# Patient Record
Sex: Female | Born: 1985 | Race: White | Hispanic: No | Marital: Married | Smoking: Never smoker
Health system: Southern US, Community
[De-identification: ages and names within clinical notes are randomized; demographics above are authoritative.]

## PROBLEM LIST (undated history)

## (undated) ENCOUNTER — Inpatient Hospital Stay (HOSPITAL_COMMUNITY): Payer: Self-pay

## (undated) DIAGNOSIS — H6690 Otitis media, unspecified, unspecified ear: Secondary | ICD-10-CM

## (undated) DIAGNOSIS — F419 Anxiety disorder, unspecified: Secondary | ICD-10-CM

## (undated) DIAGNOSIS — J029 Acute pharyngitis, unspecified: Secondary | ICD-10-CM

## (undated) DIAGNOSIS — Z8619 Personal history of other infectious and parasitic diseases: Secondary | ICD-10-CM

## (undated) HISTORY — DX: Acute pharyngitis, unspecified: J02.9

## (undated) HISTORY — DX: Personal history of other infectious and parasitic diseases: Z86.19

## (undated) HISTORY — DX: Otitis media, unspecified, unspecified ear: H66.90

---

## 2008-08-29 ENCOUNTER — Emergency Department (HOSPITAL_COMMUNITY): Admission: EM | Admit: 2008-08-29 | Discharge: 2008-08-29 | Payer: Self-pay | Admitting: Emergency Medicine

## 2009-02-22 ENCOUNTER — Inpatient Hospital Stay (HOSPITAL_COMMUNITY): Admission: AD | Admit: 2009-02-22 | Discharge: 2009-02-23 | Payer: Self-pay | Admitting: Obstetrics and Gynecology

## 2009-04-09 ENCOUNTER — Inpatient Hospital Stay (HOSPITAL_COMMUNITY): Admission: AD | Admit: 2009-04-09 | Discharge: 2009-04-11 | Payer: Self-pay | Admitting: Obstetrics and Gynecology

## 2009-05-12 ENCOUNTER — Emergency Department (HOSPITAL_COMMUNITY): Admission: EM | Admit: 2009-05-12 | Discharge: 2009-05-12 | Payer: Self-pay | Admitting: Emergency Medicine

## 2010-07-24 ENCOUNTER — Emergency Department (HOSPITAL_COMMUNITY): Admission: EM | Admit: 2010-07-24 | Discharge: 2010-07-24 | Payer: Self-pay | Admitting: Emergency Medicine

## 2010-12-31 LAB — CBC
HCT: 32.7 % — ABNORMAL LOW (ref 36.0–46.0)
MCV: 86.5 fL (ref 78.0–100.0)
Platelets: 213 10*3/uL (ref 150–400)
RDW: 14.4 % (ref 11.5–15.5)

## 2010-12-31 LAB — RPR: RPR Ser Ql: NONREACTIVE

## 2011-01-01 LAB — STREP B DNA PROBE: Strep Group B Ag: NEGATIVE

## 2011-01-01 LAB — URINALYSIS, ROUTINE W REFLEX MICROSCOPIC
Bilirubin Urine: NEGATIVE
Hgb urine dipstick: NEGATIVE
Nitrite: NEGATIVE
Protein, ur: NEGATIVE mg/dL
Urobilinogen, UA: 0.2 mg/dL (ref 0.0–1.0)

## 2011-01-01 LAB — URINE CULTURE

## 2011-02-06 NOTE — H&P (Signed)
NAME:  Abigail Hanson, Abigail Hanson NO.:  [id number]   MEDICAL RECORD NO.:  [id number]          PATIENT TYPE:  INP   LOCATION:  9109                          FACILITY:  [facility]   PHYSICIAN:  Osborn Coho, M.D.   DATE OF BIRTH:  Sep 20, 1986   DATE OF ADMISSION:  04/09/2009  DATE OF DISCHARGE:                              HISTORY & PHYSICAL   HISTORY OF PRESENT ILLNESS:  The patient is a 25 year old gravida 4,  para 3-2-0-2, admitted at 34 and 07th weeks with complaints of pain with  regular uterine contractions since 3 p.m., the day of admission.  The  patient denies leakage of fluid or bleeding, however, she has noticed  some increased discharge.  The patient reports her fetus has been moving  normally.  The patient's pregnancy has been followed by the [facility]  of [facility], and remarkable for:  1. First trimester spotting.  2. Previous low-transverse cesarean section.  3. History of previous VBAC, desires repeat VBAC.  4. History of macrosomia.  5. 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   HISTORY OF PRESENT PREGNANCY:  The patient entered care at 4 weeks'  gestation.  The patient at that time reported that she planned a vaginal  birth after cesarean.  The patient declined all genetic screens at that  time.  The patient had had some spotting early in the pregnancy, which  had resolved at that time.  At 16 weeks, the patient declined quad  screening.  At 19 weeks, ultrasound was obtained for anatomy, size was  consistent with dates.  Cervix 3.16 cm.  Normal fluid.  Left choroid  plexus cyst noted at the fetus at that time.  The patient met with her  MD at 23 weeks and ordered to discuss vaginal birth after cesarean.  Ultrasound was repeated at that time and it was noted that choroid  plexus cyst had resolved and size was consistent with dates.   The  patient elected at that time again vaginal birth after cesarean at 27  weeks, the patient with complaints of some Braxton Hicks contractions.  The patient's VBAC consent was done at that time.  The patient underwent  Glucola at 27 weeks as well as hemoglobin.  At 29 weeks, the patient  with complaints of hip pain and the patient reports a desire for  noninterventive birth.  No Pitocin and the baby with little or no  nursery time.  At 31 weeks, the patient with complaints of low back pain  and comfort measures were discussed with the patient.  At 33 weeks, the  patient was evaluated with complaints of decreased fetal movement.  Fetal counts were explained to the patient and NST obtained at that  time, was reactive.  The patient also underwent Group B strep screening  and majority of admissions at 33 weeks when the patient was seen for  contractions.  At 35 weeks, the patient with a history of self treatment  of yeast for approximately 2 weeks;  however, at that time, the patient  was diagnosed with bacterial vaginosis and was treated with  metronidazole b.i.d. for 7 days.  Gonorrhea, Chlamydia, and Group B  strep were obtained as well at 35 weeks.  Cervix was closed at that  time.  Urine culture was sent as well at 35 weeks due to 3+ leukocyte  esterase, and 2+ glucose, 1+ ketones, and trace of blood.  At 36 weeks,  the patient with continued complaints of vaginal irritation.  At that  time, the patient was diagnosed with yeast.  The patient's vaginal exam  at that time was 1-2 cm, -1 to 0 station.  The patient was treated for  yeast at that time.   PRENATAL LABORATORIES:  Initial prenatal labs; initial hemoglobin 13.8,  hematocrit 41.0, and platelets 210,000.  Blood type O+.  Antibody screen  negative.  RPR nonreactive.  Rubella titer immune.  Hepatitis B surface  antigen negative.  HIV nonreactive.  All genetic screens were declined.  Hemoglobin at 27 weeks 12.4, 1-hour Glucola of  111.  RPR nonreactive.  Group B strep at 33 weeks negative.  Repeat Group B strep at 35 weeks  negative.  Negative Gonorrhea.  Negative Chlamydia.   OBSTETRICAL HISTORY:  Pregnancy #1:  In January 2005, spontaneous  vaginal delivery, 9 pounds 7 ounces, [redacted] weeks gestation, female infant,  14 hour labor.  Spontaneous vaginal delivery.  The patient with no  complications other than hyperemesis in the pregnancy.  Pregnancy #2:  In April 2007, primary low-transverse cesarean section,  female infant, 3 pounds 15 ounces, [redacted] weeks gestation, spinal  anesthesia, delivery due to oligohydramnios and intrauterine growth  restriction with chromosomal abnormality and deceased at 25 years of age.  Pregnancy #3:  In June 2008, spontaneous vaginal delivery, female infant,  7 pounds 8 ounces, 36 weeks and 6 days, 15 hour labor, epidural  anesthesia.  No complications during pregnancy.  Pregnancy #4:  Current.  LMP July 08, 2008.  Certain LMP with regular  monthly menses.   GYNECOLOGICAL HISTORY:  The patient with 30-day cycle.  The patient has  used Depo-Provera, oral contraceptives, and condoms for contraception in  the past.  The patient denies any history of abnormal Pap or STDs.   PAST MEDICAL HISTORY:  Actually negative.   SURGICAL HISTORY:  Cesarean section 2007.   HOSPITALIZATIONS:  Overnight stay at 25 years of age for ear infection,  concussion at age 58.   ALLERGIES:  No known drug allergies.   MEDICATIONS:  Prenatal vitamins.   FAMILY HISTORY:  Maternal grandfather, coronary artery disease, lung  cancer; father, hypertension; maternal grandmother, colon cancer;  maternal aunt, leukemia; paternal grandmother, cancer of unknown  etiology; mother with anxiety.   GENETIC HISTORY:  Father of the baby's uncle with missing vetebra and a  maternal uncle with one smaller hand.  Father of the baby's maternal  aunt was found with a heart defect, father of the baby's mother with  scoliosis.   The patient and the father of the baby of second child with  ambiguous genitalia and X1 chromosome, no uterus or ovaries, and had  testicular tissues, and died at age 55.   SOCIAL HISTORY:  The patient is married to the father of the baby who is  involved and supportive.  The patient with eleventh grade education and  did complete her GED and she is currently unemployed.  Father of the  baby, Zafira Munos is employed part time and has a bachelor's  degree  education.  The patient denies any use of alcohol, tobacco, or street  drugs.  Domestic violence screen is negative.   OBJECTIVE:  VITAL SIGNS:  The patient is afebrile.  Blood pressure  115/69, other vital signs were stable.  GENERAL:  The patient is alert and oriented, in no apparent distress.  SKIN:  Warm and dry.  Color is satisfactory.  HEENT:  Within normal limits.  NECK:  Without masses or thyroid enlargement noted.  HEART:  Regular rate and rhythm.  No murmur, rub, or gallop.  LUNGS:  Clear to AP auscultation.  BREASTS:  Soft.  ABDOMEN:  Soft, gravid, nontender.  Fundal height 39 cm.  Fetal is  vertex to [place] maneuvers.  Estimated fetal weight 8-9 pounds.  Fetal  heart rate at baseline 140s with accelerations noted.  No decelerations  were noted.  Variability is present.  Uterine contractions are noted to  be every 2-3 minutes on toco and strong to palpation.  Adequate resting  time of the uterus is noted.  Sterile vaginal exam of the cervix, found  cervix to be 9 cm dilated, completely effaced.  Vertex is 0 station with  bulging bag of water in the vagina.  EXTREMITIES:  Negative Homans and negative edema bilaterally.  Deep  tendon reflexes are 2+.  No clonus.   ASSESSMENT:  1. Intrauterine pregnancy at 66 and 0/7 weeks.  2. Active labor.  3. History of low-transverse cesarean section and vaginal birth, and      the patient desires repeat vaginal birth after cesarean attempt.   PLAN:  The patient will be admitted to  birthing suites for consult with  Dr. Su Hilt.  The patient desires a noninterventive birth if possible  and risk, benefits, alternatives of vaginal birth after cesarean were  again discussed with the patient and they desire to proceed.  Routine  [facility] admission orders and excepted management at the present time.      Rhona Leavens, CNM      Osborn Coho, M.D.  Electronically Signed    NOS/MEDQ  D:  04/10/2009  T:  04/10/2009  Job:  [id number]

## 2011-02-06 NOTE — Discharge Summary (Signed)
NAME:  Abigail, Hanson NO.:  [id number]   MEDICAL RECORD NO.:  [id number]          PATIENT TYPE:  INP   LOCATION:  9109                          FACILITY:  [facility]   PHYSICIAN:  Crist Fat. Rivard, M.D. DATE OF BIRTH:  08/09/1986   DATE OF ADMISSION:  04/09/2009  DATE OF DISCHARGE:  04/11/2009                               DISCHARGE SUMMARY   HISTORY OF PRESENT ILLNESS:  A 25 year old gravida 4, para 3-2-0-2  admitted at 75 and 7th week with complaining of pain with regular  uterine contractions since 3 o'clock on day of admission.  The patient  was seen at [facility] for first trimester spotting,  previous low transverse C-section, history of previous VBAC desires  repeat VBAC, history of macrosomia, history of congenital chromosomal  abnormalities, intrauterine growth restriction, oligohydramnios, and  infant death of 15-year-old.   HOSPITAL COURSE:  She was admitted in early labor.  Blood pressure on  admission was 115/69, other vital signs were stable.  The patient is  alert and oriented in no apparent distress.  Skin warm and dry.  Heart  regular rate.  No murmur or gallops.  Lungs are clear to auscultation.  Abdomen soft, gravid, and nontender.  Fundal height is 39 cm.  __________.  Estimated fetal weight 8-9 pounds.  Fetal heart rate at  baseline was 140 with accelerations, no decels were noted.  Viability  present.  Uterine contractions are noted every 2-3 minutes and strong to  palpation.  Adequate resting tones.  Sterile vaginal exam of cervix  found the patient to be 9 cm dilated.  Negative Homans.  Negative edema  bilaterally.   ASSESSMENT:  The patient has active labor, history of low transverse  cesarean section and vaginal birth and the patient desires repeat  vaginal birth after cesarean section.   PLAN:  To be admitted to the [facility] with consult with Dr.  Su Hilt.  The patient would like to have an uninterrupted vaginal  delivery.   DELIVERY NOTE:  At April 09, 2009, at 10:25 p.m., artificial rupture of  membranes was performed at 2110 with moderate clear fluid.  Vaginal exam  after AROM, was at anterior lip, reduced with uterine contractions to  complete at 2120.  The patient pushed effectively with spontaenous urge  to push and a viable female, infant named, Allyn Kenner, at 2140.  No nuchal  cord.  No difficulty with shoulders.  Infant with spontaneous cry and  vigorous at birth.  I placed him on mother's abdomen and cord was doubly  clamped and cut by the CNM.  Placenta spontaenous Duncan mechanism at  2145.  Three vessel cord was noted.  Upper vaginal inspection is intact.  Perineum with a first-degree laceration and was repaired in the usual  fashion with 2-0 Vicryl after 15% Xylocaine.  Estimated blood loss was  400 mL.  Mother and infant were in satisfactory condition.  The patient  was referred to the social worker due to history of anxiety and the  death of her 66-year-old child.  She denies any feelings of anxiety or  depression.  On the  day of discharge, she was breast feeding.  She  wanted the Depo-Provera prior to discharge.  Vital signs were within  normal limits.  Lungs clear bilaterally.  Heart regular rate without  murmur.  Abdomen soft.  Bowel sounds x4.  Fundus firm.  Postpartum was  day 2 was satisfactory.  She was discharged home with [facility]  OB[facility] pamphlet.  She did get Depo prior to discharge.  She is to return  in the office 4-6 weeks.  She needs to make appointment.   DISCHARGE DIAGNOSIS:  Spontaneous vaginal delivery of a 9 pound 1 ounce  baby.   She is planning on having circumcision done in the office and a fax was  sent to the office of the new born.  She was discharged home on  ibuprofen.  The Apgars of the baby were 9 and 9.  Hemoglobin on  discharge 11.11.  She is planing on using the Depo until her husband to  get a vasectomy.      Jasmine Awe,  CNM      Dois Davenport A. Rivard, M.D.  Electronically Signed    JM/MEDQ  D:  04/11/2009  T:  04/12/2009  Job:  [id number]

## 2011-03-07 ENCOUNTER — Emergency Department (HOSPITAL_COMMUNITY)
Admission: EM | Admit: 2011-03-07 | Discharge: 2011-03-08 | Disposition: A | Discharge status: Home / Self Care | Payer: Medicaid Other | Attending: Emergency Medicine | Admitting: Emergency Medicine

## 2011-03-07 DIAGNOSIS — R0989 Other specified symptoms and signs involving the circulatory and respiratory systems: Secondary | ICD-10-CM | POA: Insufficient documentation

## 2011-03-07 DIAGNOSIS — R0609 Other forms of dyspnea: Secondary | ICD-10-CM | POA: Insufficient documentation

## 2011-03-07 DIAGNOSIS — R209 Unspecified disturbances of skin sensation: Secondary | ICD-10-CM | POA: Insufficient documentation

## 2011-03-07 DIAGNOSIS — R55 Syncope and collapse: Secondary | ICD-10-CM | POA: Insufficient documentation

## 2011-03-07 DIAGNOSIS — R002 Palpitations: Secondary | ICD-10-CM | POA: Insufficient documentation

## 2011-03-07 LAB — DIFFERENTIAL
Basophils Relative: 0 % (ref 0–1)
Eosinophils Absolute: 0.1 10*3/uL (ref 0.0–0.7)
Monocytes Relative: 7 % (ref 3–12)
Neutrophils Relative %: 56 % (ref 43–77)

## 2011-03-07 LAB — URINE MICROSCOPIC-ADD ON

## 2011-03-07 LAB — URINALYSIS, ROUTINE W REFLEX MICROSCOPIC
Glucose, UA: NEGATIVE mg/dL
Specific Gravity, Urine: 1.019 (ref 1.005–1.030)
Urobilinogen, UA: 1 mg/dL (ref 0.0–1.0)
pH: 6 (ref 5.0–8.0)

## 2011-03-07 LAB — GLUCOSE, CAPILLARY: Glucose-Capillary: 94 mg/dL (ref 70–99)

## 2011-03-07 LAB — BASIC METABOLIC PANEL
CO2: 26 mEq/L (ref 19–32)
Calcium: 9.1 mg/dL (ref 8.4–10.5)
GFR calc non Af Amer: >60 mL/min (ref >60)
Sodium: 139 mEq/L (ref 135–145)

## 2011-03-07 LAB — CBC
MCH: 30.7 pg (ref 26.0–34.0)
MCHC: 35.2 g/dL (ref 30.0–36.0)
Platelets: 205 10*3/uL (ref 150–400)
RBC: 4.49 MIL/uL (ref 3.87–5.11)

## 2011-03-08 LAB — TSH: TSH: 1.917 u[IU]/mL (ref 0.350–4.500)

## 2011-04-20 ENCOUNTER — Encounter: Payer: Self-pay | Admitting: Internal Medicine

## 2011-04-20 ENCOUNTER — Encounter: Payer: Self-pay | Admitting: *Deleted

## 2011-04-23 ENCOUNTER — Encounter: Payer: Self-pay | Admitting: Internal Medicine

## 2011-04-23 ENCOUNTER — Ambulatory Visit (INDEPENDENT_AMBULATORY_CARE_PROVIDER_SITE_OTHER): Payer: Medicaid Other | Admitting: Internal Medicine

## 2011-04-23 DIAGNOSIS — R55 Syncope and collapse: Secondary | ICD-10-CM

## 2011-04-23 DIAGNOSIS — R002 Palpitations: Secondary | ICD-10-CM

## 2011-04-23 NOTE — Assessment & Plan Note (Signed)
The patient had an episode of syncope without warning. She has history of tachycardia palpitations and she had palpitations prior to this episode. He raises the possibility as to whether she has SVT especially given the short PR interval on her about her cardiogram but not withstanding absence of a delta wave.  We'll use an event recorder to try to elucidate this.

## 2011-04-23 NOTE — Assessment & Plan Note (Signed)
As above.

## 2011-04-23 NOTE — Patient Instructions (Signed)
Your physician has recommended that you wear an event monitor. Event monitors are medical devices that record the heart's electrical activity. Doctors most often [place] these monitors to diagnose arrhythmias. Arrhythmias are problems with the speed or rhythm of the heartbeat. The monitor is a small, portable device. You can wear one while you do your normal daily activities. This is usually used to diagnose what is causing palpitations/syncope (passing out).  Your physician recommends that you schedule a follow-up appointment in: 6 weeks.

## 2011-04-23 NOTE — Progress Notes (Signed)
HPI: Abigail Hanson is a 25 y.o. female Seen at the request of Dr. Algie Coffer because of an episode of syncope.  She is a 70 year old mother of 4 (of whom 3 her surviving) who has a history of recurrent abrupt onset gradual offset tachypalpitations associated with lightheadedness. They typically lasts seconds to minutes. One of such episodes preceding her syncopal episode  She does not notice any association with caffeine.  An echo done by Dr Algie Coffer was apparently normal   No current outpatient prescriptions on file.    No Known Allergies  Past Medical History  Diagnosis Date  . Pharyngitis   . Macrosomia     Past Surgical History  Procedure Date  . Cesarean section     Family History  Problem Relation Age of Onset  . Hypertension      History   Social History  . Marital Status: Single    Spouse Name: N/A    Number of Children: N/A  . Years of Education: N/A   Occupational History  . Not on file.   Social History Main Topics  . Smoking status: Never Smoker   . Smokeless tobacco: Not on file  . Alcohol Use: Not on file  . Drug Use: Not on file  . Sexually Active: Not on file   Other Topics Concern  . Not on file   Social History Narrative  . No narrative on file    Fourteen point review of systems was negative except as noted in HPI and PMH   PHYSICAL EXAMINATION  Blood pressure 100/64, pulse 58, height 5\' 2"  (1.575 m), weight 126 lb (57.153 kg).   Well developed and nourished in no acute distress HENT normal Neck supple with JVP-flat Carotids brisk and full without bruits Back without scoliosis or kyphosis Clear Regular rate and rhythm, loud S1 Abd-soft with active BS without hepatomegaly or midline pulsation Femoral pulses 2+ distal pulses intact No Clubbing cyanosis edema Skin-warm and dry LN-neg submandibular and supraclavicular A & Oriented CN 3-12 normal  Grossly normal sensory and motor function Affect engaging . ECg From the  hospital on June 13 demonstrates sinus rhythm at 59 with intervals of 0.12/0.10/0.39 there is RSR prime in lead V1 no clear evidence of a delta wave

## 2011-04-27 ENCOUNTER — Encounter (INDEPENDENT_AMBULATORY_CARE_PROVIDER_SITE_OTHER): Payer: Medicaid Other

## 2011-04-27 DIAGNOSIS — R55 Syncope and collapse: Secondary | ICD-10-CM

## 2011-04-27 DIAGNOSIS — R002 Palpitations: Secondary | ICD-10-CM

## 2011-06-14 ENCOUNTER — Ambulatory Visit: Payer: Medicaid Other | Admitting: Internal Medicine

## 2011-06-14 ENCOUNTER — Telehealth: Payer: Self-pay | Admitting: Internal Medicine

## 2011-06-15 NOTE — Telephone Encounter (Signed)
Spoke with pt, aware monitor shows sinus. She will call to schedule follow up if she has further problems Abigail Hanson

## 2011-06-29 LAB — URINALYSIS, ROUTINE W REFLEX MICROSCOPIC
Ketones, ur: NEGATIVE mg/dL
Nitrite: NEGATIVE
Protein, ur: NEGATIVE mg/dL
Urobilinogen, UA: 1 mg/dL (ref 0.0–1.0)

## 2011-06-29 LAB — WET PREP, GENITAL
Trich, Wet Prep: NONE SEEN
Yeast Wet Prep HPF POC: NONE SEEN

## 2011-07-16 ENCOUNTER — Emergency Department (HOSPITAL_COMMUNITY): Payer: Medicaid Other

## 2011-07-16 ENCOUNTER — Emergency Department (HOSPITAL_COMMUNITY)
Admission: EM | Admit: 2011-07-16 | Discharge: 2011-07-16 | Disposition: A | Discharge status: Home / Self Care | Payer: Medicaid Other | Attending: Emergency Medicine | Admitting: Emergency Medicine

## 2011-07-16 ENCOUNTER — Encounter (HOSPITAL_COMMUNITY): Payer: Self-pay | Admitting: Radiology

## 2011-07-16 DIAGNOSIS — S060X0A Concussion without loss of consciousness, initial encounter: Secondary | ICD-10-CM | POA: Insufficient documentation

## 2011-07-16 DIAGNOSIS — M25559 Pain in unspecified hip: Secondary | ICD-10-CM | POA: Insufficient documentation

## 2011-07-16 DIAGNOSIS — R51 Headache: Secondary | ICD-10-CM | POA: Insufficient documentation

## 2011-07-16 DIAGNOSIS — Z79899 Other long term (current) drug therapy: Secondary | ICD-10-CM | POA: Insufficient documentation

## 2011-07-16 DIAGNOSIS — W1809XA Striking against other object with subsequent fall, initial encounter: Secondary | ICD-10-CM | POA: Insufficient documentation

## 2011-07-16 DIAGNOSIS — Y9239 Other specified sports and athletic area as the place of occurrence of the external cause: Secondary | ICD-10-CM | POA: Insufficient documentation

## 2011-07-16 LAB — RAPID URINE DRUG SCREEN, HOSP PERFORMED
Amphetamines: NOT DETECTED
Barbiturates: NOT DETECTED
Benzodiazepines: NOT DETECTED
Cocaine: NOT DETECTED
Opiates: NOT DETECTED
Tetrahydrocannabinol: NOT DETECTED

## 2011-07-16 LAB — POCT PREGNANCY, URINE: Preg Test, Ur: NEGATIVE

## 2011-09-25 NOTE — L&D Delivery Note (Signed)
Delivery Note At 2:05 PM a viable female, "Gwenivere", was delivered via VBAC, in squatting position beside bed, Spontaneous (Presentation ROA: ;  ).  APGAR: 9, 9; weight .   Placenta status: Intact, Spontaneous.  Cord: 3 vessels with the following complications: None.  Cord pH: NA  Anesthesia: None  Episiotomy: None Lacerations: 1st degree;Perineal---hemostatic, did not require repair. Suture Repair: None Est. Blood Loss (mL): 200 dd  Mom to postpartum.  Baby to skin to skin. Family requests to take placenta home at discharge--placenta marked to be held in L&D.Marland Kitchen  Nigel Bridgeman 06/06/2012, 2:32 PM

## 2011-11-16 ENCOUNTER — Encounter (HOSPITAL_COMMUNITY): Payer: Self-pay

## 2011-11-16 ENCOUNTER — Inpatient Hospital Stay (HOSPITAL_COMMUNITY)
Admission: AD | Admit: 2011-11-16 | Discharge: 2011-11-16 | Disposition: A | Discharge status: Home / Self Care | Payer: Medicaid Other | Source: Ambulatory Visit | Attending: Obstetrics & Gynecology | Admitting: Obstetrics & Gynecology

## 2011-11-16 DIAGNOSIS — O99891 Other specified diseases and conditions complicating pregnancy: Secondary | ICD-10-CM | POA: Insufficient documentation

## 2011-11-16 DIAGNOSIS — Z3480 Encounter for supervision of other normal pregnancy, unspecified trimester: Secondary | ICD-10-CM

## 2011-11-16 HISTORY — DX: Anxiety disorder, unspecified: F41.9

## 2011-11-16 LAB — URINALYSIS, ROUTINE W REFLEX MICROSCOPIC
Bilirubin Urine: NEGATIVE
Hgb urine dipstick: NEGATIVE
Protein, ur: NEGATIVE mg/dL
Urobilinogen, UA: 0.2 mg/dL (ref 0.0–1.0)

## 2011-11-16 LAB — WET PREP, GENITAL
Clue Cells Wet Prep HPF POC: NONE SEEN
Trich, Wet Prep: NONE SEEN

## 2011-11-16 NOTE — Discharge Instructions (Signed)
ABCs of Pregnancy A Antepartum care is very important. Be sure you see your doctor and get prenatal care as soon as you think you are pregnant. At this time, you will be tested for infection, genetic abnormalities and potential problems with you and the pregnancy. This is the time to discuss diet, exercise, work, medications, labor, pain medication during labor and the possibility of a cesarean delivery. Ask any questions that may concern you. It is important to see your doctor regularly throughout your pregnancy. Avoid exposure to toxic substances and chemicals - such as cleaning solvents, lead and mercury, some insecticides, and paint. Pregnant women should avoid exposure to paint fumes, and fumes that cause you to feel ill, dizzy or faint. When possible, it is a good idea to have a pre-pregnancy consultation with your caregiver to begin some important recommendations your caregiver suggests such as, taking folic acid, exercising, quitting smoking, avoiding alcoholic beverages, etc. B Breastfeeding is the healthiest choice for both you and your baby. It has many nutritional benefits for the baby and health benefits for the mother. It also creates a very tight and loving bond between the baby and mother. Talk to your doctor, your family and friends, and your employer about how you choose to feed your baby and how they can support you in your decision. Not all birth defects can be prevented, but a woman can take actions that may increase her chance of having a healthy baby. Many birth defects happen very early in pregnancy, sometimes before a woman even knows she is pregnant. Birth defects or abnormalities of any child in your or the father's family should be discussed with your caregiver. Get a good support bra as your breast size changes. Wear it especially when you exercise and when nursing.  C Celebrate the news of your pregnancy with the your spouse/father and family. Childbirth classes are helpful to  take for you and the spouse/father because it helps to understand what happens during the pregnancy, labor and delivery. Cesarean delivery should be discussed with your doctor so you are prepared for that possibility. The pros and cons of circumcision if it is a boy, should be discussed with your pediatrician. Cigarette smoking during pregnancy can result in low birth weight babies. It has been associated with infertility, miscarriages, tubal pregnancies, infant death (mortality) and poor health (morbidity) in childhood. Additionally, cigarette smoking may cause long-term learning disabilities. If you smoke, you should try to quit before getting pregnant and not smoke during the pregnancy. Secondary smoke may also harm a mother and her developing baby. It is a good idea to ask people to stop smoking around you during your pregnancy and after the baby is born. Extra calcium is necessary when you are pregnant and is found in your prenatal vitamin, in dairy products, green leafy vegetables and in calcium supplements. D A healthy diet according to your current weight and height, along with vitamins and mineral supplements should be discussed with your caregiver. Domestic abuse or violence should be made known to your doctor right away to get the situation corrected. Drink more water when you exercise to keep hydrated. Discomfort of your back and legs usually develops and progresses from the middle of the second trimester through to delivery of the baby. This is because of the enlarging baby and uterus, which may also affect your balance. Do not take illegal drugs. Illegal drugs can seriously harm the baby and you. Drink extra fluids (water is best) throughout pregnancy to help   your body keep up with the increases in your blood volume. Drink at least 6 to 8 glasses of water, fruit juice, or milk each day. A good way to know you are drinking enough fluid is when your urine looks almost like clear water or is very light  yellow.  E Eat healthy to get the nutrients you and your unborn baby need. Your meals should include the five basic food groups. Exercise (30 minutes of light to moderate exercise a day) is important and encouraged during pregnancy, if there are no medical problems or problems with the pregnancy. Exercise that causes discomfort or dizziness should be stopped and reported to your caregiver. Emotions during pregnancy can change from being ecstatic to depression and should be understood by you, your partner and your family. F Fetal screening with ultrasound, amniocentesis and monitoring during pregnancy and labor is common and sometimes necessary. Take 400 micrograms of folic acid daily both before, when possible, and during the first few months of pregnancy to reduce the risk of birth defects of the brain and spine. All women who could possibly become pregnant should take a vitamin with folic acid, every day. It is also important to eat a healthy diet with fortified foods (enriched grain products, including cereals, rice, breads, and pastas) and foods with natural sources of folate (orange juice, green leafy vegetables, beans, peanuts, broccoli, asparagus, peas, and lentils). The father should be involved with all aspects of the pregnancy including, the prenatal care, childbirth classes, labor, delivery, and postpartum time. Fathers may also have emotional concerns about being a father, financial needs, and raising a family. G Genetic testing should be done appropriately. It is important to know your family and the father's history. If there have been problems with pregnancies or birth defects in your family, report these to your doctor. Also, genetic counselors can talk with you about the information you might need in making decisions about having a family. You can call a major medical center in your area for help in finding a board-certified genetic counselor. Genetic testing and counseling should be done  before pregnancy when possible, especially if there is a history of problems in the mother's or father's family. Certain ethnic backgrounds are more at risk for genetic defects. H Get familiar with the hospital where you will be having your baby. Get to know how long it takes to get there, the labor and delivery area, and the hospital procedures. Be sure your medical insurance is accepted there. Get your home ready for the baby including, clothes, the baby's room (when possible), furniture and car seat. Hand washing is important throughout the day, especially after handling raw meat and poultry, changing the baby's diaper or using the bathroom. This can help prevent the spread of many bacteria and viruses that cause infection. Your hair may become dry and thinner, but will return to normal a few weeks after the baby is born. Heartburn is a common problem that can be treated by taking antacids recommended by your caregiver, eating smaller meals 5 or 6 times a day, not drinking liquids when eating, drinking between meals and raising the head of your bed 2 to 3 inches. I Insurance to cover you, the baby, doctor and hospital should be reviewed so that you will be prepared to pay any costs not covered by your insurance plan. If you do not have medical insurance, there are usually clinics and services available for you in your community. Take 30 milligrams of iron during   your pregnancy as prescribed by your doctor to reduce the risk of low red blood cells (anemia) later in pregnancy. All women of childbearing age should eat a diet rich in iron. J There should be a joint effort for the mother, father and any other children to adapt to the pregnancy financially, emotionally, and psychologically during the pregnancy. Join a support group for moms-to-be. Or, join a class on parenting or childbirth. Have the family participate when possible. K Know your limits. Let your caregiver know if you experience any of the  following:   Pain of any kind.   Strong cramps.   You develop a lot of weight in a short period of time (5 pounds in 3 to 5 days).   Vaginal bleeding, leaking of amniotic fluid.   Headache, vision problems.   Dizziness, fainting, shortness of breath.   Chest pain.   Fever of 102 F (38.9 C) or higher.   Gush of clear fluid from your vagina.   Painful urination.   Domestic violence.   Irregular heartbeat (palpitations).   Rapid beating of the heart (tachycardia).   Constant feeling sick to your stomach (nauseous) and vomiting.   Trouble walking, fluid retention (edema).   Muscle weakness.   If your baby has decreased activity.   Persistent diarrhea.   Abnormal vaginal discharge.   Uterine contractions at 20-minute intervals.   Back pain that travels down your leg.  L Learn and practice that what you eat and drink should be in moderation and healthy for you and your baby. Legal drugs such as alcohol and caffeine are important issues for pregnant women. There is no safe amount of alcohol a woman can drink while pregnant. Fetal alcohol syndrome, a disorder characterized by growth retardation, facial abnormalities, and central nervous system dysfunction, is caused by a woman's use of alcohol during pregnancy. Caffeine, found in tea, coffee, soft drinks and chocolate, should also be limited. Be sure to read labels when trying to cut down on caffeine during pregnancy. More than 200 foods, beverages, and over-the-counter medications contain caffeine and have a high salt content! There are coffees and teas that do not contain caffeine. M Medical conditions such as diabetes, epilepsy, and high blood pressure should be treated and kept under control before pregnancy when possible, but especially during pregnancy. Ask your caregiver about any medications that may need to be changed or adjusted during pregnancy. If you are currently taking any medications, ask your caregiver if it  is safe to take them while you are pregnant or before getting pregnant when possible. Also, be sure to discuss any herbs or vitamins you are taking. They are medicines, too! Discuss with your doctor all medications, prescribed and over-the-counter, that you are taking. During your prenatal visit, discuss the medications your doctor may give you during labor and delivery. N Never be afraid to ask your doctor or caregiver questions about your health, the progress of the pregnancy, family problems, stressful situations, and recommendation for a pediatrician, if you do not have one. It is better to take all precautions and discuss any questions or concerns you may have during your office visits. It is a good idea to write down your questions before you visit the doctor. O Over-the-counter cough and cold remedies may contain alcohol or other ingredients that should be avoided during pregnancy. Ask your caregiver about prescription, herbs or over-the-counter medications that you are taking or may consider taking while pregnant.  P Physical activity during pregnancy can   benefit both you and your baby by lessening discomfort and fatigue, providing a sense of well-being, and increasing the likelihood of early recovery after delivery. Light to moderate exercise during pregnancy strengthens the belly (abdominal) and back muscles. This helps improve posture. Practicing yoga, walking, swimming, and cycling on a stationary bicycle are usually safe exercises for pregnant women. Avoid scuba diving, exercise at high altitudes (over 3000 feet), skiing, horseback riding, contact sports, etc. Always check with your doctor before beginning any kind of exercise, especially during pregnancy and especially if you did not exercise before getting pregnant. Q Queasiness, stomach upset and morning sickness are common during pregnancy. Eating a couple of crackers or dry toast before getting out of bed. Foods that you normally love may  make you feel sick to your stomach. You may need to substitute other nutritious foods. Eating 5 or 6 small meals a day instead of 3 large ones may make you feel better. Do not drink with your meals, drink between meals. Questions that you have should be written down and asked during your prenatal visits. R Read about and make plans to baby-proof your home. There are important tips for making your home a safer environment for your baby. Review the tips and make your home safer for you and your baby. Read food labels regarding calories, salt and fat content in the food. S Saunas, hot tubs, and steam rooms should be avoided while you are pregnant. Excessive high heat may be harmful during your pregnancy. Your caregiver will screen and examine you for sexually transmitted diseases and genetic disorders during your prenatal visits. Learn the signs of labor. Sexual relations while pregnant is safe unless there is a medical or pregnancy problem and your caregiver advises against it. T Traveling long distances should be avoided especially in the third trimester of your pregnancy. If you do have to travel out of state, be sure to take a copy of your medical records and medical insurance plan with you. You should not travel long distances without seeing your doctor first. Most airlines will not allow you to travel after 36 weeks of pregnancy. Toxoplasmosis is an infection caused by a parasite that can seriously harm an unborn baby. Avoid eating undercooked meat and handling cat litter. Be sure to wear gloves when gardening. Tingling of the hands and fingers is not unusual and is due to fluid retention. This will go away after the baby is born. U Womb (uterus) size increases during the first trimester. Your kidneys will begin to function more efficiently. This may cause you to feel the need to urinate more often. You may also leak urine when sneezing, coughing or laughing. This is due to the growing uterus pressing  against your bladder, which lies directly in front of and slightly under the uterus during the first few months of pregnancy. If you experience burning along with frequency of urination or bloody urine, be sure to tell your doctor. The size of your uterus in the third trimester may cause a problem with your balance. It is advisable to maintain good posture and avoid wearing high heels during this time. An ultrasound of your baby may be necessary during your pregnancy and is safe for you and your baby. V Vaccinations are an important concern for pregnant women. Get needed vaccines before pregnancy. Center for Disease Control (www.cdc.gov) has clear guidelines for the use of vaccines during pregnancy. Review the list, be sure to discuss it with your doctor. Prenatal vitamins are helpful   and healthy for you and the baby. Do not take extra vitamins except what is recommended. Taking too much of certain vitamins can cause overdose problems. Continuous vomiting should be reported to your caregiver. Varicose veins may appear especially if there is a family history of varicose veins. They should subside after the delivery of the baby. Support hose helps if there is leg discomfort. W Being overweight or underweight during pregnancy may cause problems. Try to get within 15 pounds of your ideal weight before pregnancy. Remember, pregnancy is not a time to be dieting! Do not stop eating or start skipping meals as your weight increases. Both you and your baby need the calories and nutrition you receive from a healthy diet. Be sure to consult with your doctor about your diet. There is a formula and diet plan available depending on whether you are overweight or underweight. Your caregiver or nutritionist can help and advise you if necessary. X Avoid X-rays. If you must have dental work or diagnostic tests, tell your dentist or physician that you are pregnant so that extra care can be taken. X-rays should only be taken when  the risks of not taking them outweigh the risk of taking them. If needed, only the minimum amount of radiation should be used. When X-rays are necessary, protective lead shields should be used to cover areas of the body that are not being X-rayed. Y Your baby loves you. Breastfeeding your baby creates a loving and very close bond between the two of you. Give your baby a healthy environment to live in while you are pregnant. Infants and children require constant care and guidance. Their health and safety should be carefully watched at all times. After the baby is born, rest or take a nap when the baby is sleeping. Z Get your ZZZs. Be sure to get plenty of rest. Resting on your side as often as possible, especially on your left side is advised. It provides the best circulation to your baby and helps reduce swelling. Try taking a nap for 30 to 45 minutes in the afternoon when possible. After the baby is born rest or take a nap when the baby is sleeping. Try elevating your feet for that amount of time when possible. It helps the circulation in your legs and helps reduce swelling.  Most information courtesy of the CDC. Document Released: 09/10/2005 Document Revised: 05/23/2011 Document Reviewed: 05/25/2009 [employer] Patient Information 2012 [employer]. 

## 2011-11-16 NOTE — ED Provider Notes (Signed)
History     CSN: [id number]  Arrival date & time 11/16/11  1200   None     Chief Complaint  Patient presents with  . Possible Pregnancy    HPI Abigail Hanson is a 26 y.o. female @ [redacted]w[redacted]d gestation who presents to [facility] for " check up for the baby". Denies bleeding or pain. Thick yellow vaginal discharge. Plans care with [facility] but has not got medicaid card so can not go yet. Patient states she is just anxious because her last pregnancy she delivered a full term baby that was very small and had multiple abnormalities and lived only 2 years.  The history was provided by the patient.  Past Medical History  Diagnosis Date  . Pharyngitis   . Macrosomia   . Anxiety     no meds    Past Surgical History  Procedure Date  . Cesarean section     Family History  Problem Relation Age of Onset  . Hypertension    . Anesthesia problems Neg Hx     History  Substance Use Topics  . Smoking status: Never Smoker   . Smokeless tobacco: Not on file  . Alcohol Use: No    OB History    Grav Para Term Preterm Abortions TAB SAB Ect Mult Living   5 4 2 2      3       Review of Systems  Constitutional: Negative for fever, chills, diaphoresis and fatigue.  HENT: Positive for congestion and sinus pressure. Negative for ear pain, sore throat, facial swelling, neck pain, neck stiffness and dental problem.   Eyes: Negative for photophobia, pain and discharge.  Respiratory: Negative for cough, chest tightness and wheezing.   Cardiovascular: Negative.   Gastrointestinal: Positive for nausea and vomiting. Negative for abdominal pain, diarrhea, constipation and abdominal distention.  Genitourinary: Positive for frequency and vaginal discharge. Negative for dysuria, flank pain, vaginal bleeding, difficulty urinating and dyspareunia.  Musculoskeletal: Negative for myalgias, back pain and gait problem.  Skin: Negative for color change and rash.  Neurological: Positive for headaches. Negative for  dizziness, speech difficulty, weakness, light-headedness and numbness.  Psychiatric/Behavioral: Negative for confusion and agitation. The patient is nervous/anxious.     Allergies  Review of patient's allergies indicates no known allergies.  Home Medications  No current outpatient prescriptions on file.  BP 109/63  Pulse 61  Temp(Src) 98.5 F (36.9 C) (Oral)  Resp 16  Ht 5\' 2"  (1.575 m)  Wt 126 lb 9.6 oz (57.425 kg)  BMI 23.16 kg/m2  SpO2 100%  LMP 09/07/2011  Physical Exam  Nursing note and vitals reviewed. Constitutional: She is oriented to person, place, and time. She appears well-developed and well-nourished.  HENT:  Head: Normocephalic.  Eyes: EOM are normal.  Neck: Neck supple.  Cardiovascular: Normal rate.   Pulmonary/Chest: Effort normal.  Abdominal: Soft. There is no tenderness.  Genitourinary:       External genitalia without lesions. Mucous discharge vaginal vault. Cervix closed, long, no CMT, uterus approximately 10 week size.   Musculoskeletal: Normal range of motion.  Neurological: She is alert and oriented to person, place, and time. No cranial nerve deficit.  Skin: Skin is warm and dry.  Psychiatric: She has a normal mood and affect. Her behavior is normal. Judgment and thought content normal.   Results for orders placed during the hospital encounter of 11/16/11 (from the past 24 hour(s))  POCT PREGNANCY, URINE     Status: Abnormal   Collection Time  11/16/11 12:30 PM      Component Value Range   Preg Test, Ur POSITIVE (*) NEGATIVE   WET PREP, GENITAL     Status: Abnormal   Collection Time   11/16/11  1:03 PM      Component Value Range   Yeast Wet Prep HPF POC NONE SEEN  NONE SEEN    Trich, Wet Prep NONE SEEN  NONE SEEN    Clue Cells Wet Prep HPF POC NONE SEEN  NONE SEEN    WBC, Wet Prep HPF POC FEW (*) NONE SEEN   URINALYSIS, ROUTINE W REFLEX MICROSCOPIC     Status: Abnormal   Collection Time   11/16/11  1:26 PM      Component Value Range    Color, Urine YELLOW  YELLOW    APPearance HAZY (*) CLEAR    Specific Gravity, Urine 1.025  1.005 - 1.030    pH 6.5  5.0 - 8.0    Glucose, UA NEGATIVE  NEGATIVE (mg/dL)   Hgb urine dipstick NEGATIVE  NEGATIVE    Bilirubin Urine NEGATIVE  NEGATIVE    Ketones, ur 15 (*) NEGATIVE (mg/dL)   Protein, ur NEGATIVE  NEGATIVE (mg/dL)   Urobilinogen, UA 0.2  0.0 - 1.0 (mg/dL)   Nitrite NEGATIVE  NEGATIVE    Leukocytes, UA NEGATIVE  NEGATIVE     ED Course  Procedures: Informal ultrasound bedside shows IUP with cardiac activity. Patient able to visualize heart beat. Pictures given to patient.  Doppler FHT's at 160.   Assessment: First trimester pregnancy  Plan:  Start prenatal care   Start prenatal vitamins   Return as needed.  MDM          Kerrie Buffalo, NP 11/16/11 1347

## 2011-11-16 NOTE — Progress Notes (Signed)
Patient states she has had a pregnancy test at home that was positive in January. Patient states that she thinks that she might have been pregnancy in October and had a fall with heavy bleeding a few days later but did not have a documented pregnancy test. Has been unable to get into care and wants to make sure everything if OK. Is not having any pain or bleeding.

## 2011-11-17 LAB — GC/CHLAMYDIA PROBE AMP, GENITAL
Chlamydia, DNA Probe: NEGATIVE
GC Probe Amp, Genital: NEGATIVE

## 2011-12-18 ENCOUNTER — Encounter (INDEPENDENT_AMBULATORY_CARE_PROVIDER_SITE_OTHER): Payer: Medicaid Other

## 2011-12-18 DIAGNOSIS — Z3480 Encounter for supervision of other normal pregnancy, unspecified trimester: Secondary | ICD-10-CM

## 2011-12-18 LAB — OB RESULTS CONSOLE ABO/RH

## 2011-12-18 LAB — OB RESULTS CONSOLE ANTIBODY SCREEN: Antibody Screen: NEGATIVE

## 2011-12-25 ENCOUNTER — Encounter (INDEPENDENT_AMBULATORY_CARE_PROVIDER_SITE_OTHER): Payer: Medicaid Other | Admitting: Obstetrics and Gynecology

## 2011-12-25 DIAGNOSIS — Z331 Pregnant state, incidental: Secondary | ICD-10-CM

## 2012-01-08 ENCOUNTER — Telehealth: Payer: Self-pay | Admitting: Obstetrics and Gynecology

## 2012-01-08 NOTE — Telephone Encounter (Signed)
Routed to triage 

## 2012-01-10 NOTE — Telephone Encounter (Signed)
Spoke with pt rgd msg pt states have belly button piercing thought it was infected but it looks much better now advised pt to call office if she gets concerned again pt voice understanding.

## 2012-01-17 ENCOUNTER — Encounter: Payer: Self-pay | Admitting: Obstetrics and Gynecology

## 2012-01-17 ENCOUNTER — Ambulatory Visit (INDEPENDENT_AMBULATORY_CARE_PROVIDER_SITE_OTHER): Payer: Medicaid Other | Admitting: Obstetrics and Gynecology

## 2012-01-17 ENCOUNTER — Telehealth: Payer: Self-pay

## 2012-01-17 ENCOUNTER — Telehealth: Payer: Self-pay | Admitting: Obstetrics and Gynecology

## 2012-01-17 VITALS — BP 90/56 | Wt 135.5 lb

## 2012-01-17 DIAGNOSIS — O368190 Decreased fetal movements, unspecified trimester, not applicable or unspecified: Secondary | ICD-10-CM

## 2012-01-17 NOTE — Telephone Encounter (Signed)
Routed to jackie  

## 2012-01-17 NOTE — Progress Notes (Signed)
C/o decreased FM Pt reassured now that she heard FHTs Keep appt on Tues for ROB and anatomy scan

## 2012-01-17 NOTE — Telephone Encounter (Signed)
Spoke to pt today. She states she has not felt any movement today at all. She is 18.6 weeks today and states that previously she was feeling really good FM every day. I encouraged her to either drink some juice or have a caffeinated bev. And eat a healthy snack and then sit for awhile and really pay attention to see if she feels any movement and then report back to me with results. She is agreeable. When pt called back she states she still has felt no FM. Per AR she can come in for work up and FHT's only. Abigail Hanson A

## 2012-01-21 ENCOUNTER — Other Ambulatory Visit: Payer: Self-pay | Admitting: Obstetrics and Gynecology

## 2012-01-21 DIAGNOSIS — Z1389 Encounter for screening for other disorder: Secondary | ICD-10-CM

## 2012-01-22 ENCOUNTER — Ambulatory Visit (INDEPENDENT_AMBULATORY_CARE_PROVIDER_SITE_OTHER): Payer: Medicaid Other

## 2012-01-22 ENCOUNTER — Other Ambulatory Visit: Payer: Self-pay | Admitting: Obstetrics and Gynecology

## 2012-01-22 ENCOUNTER — Encounter: Payer: Medicaid Other | Admitting: Obstetrics and Gynecology

## 2012-01-22 ENCOUNTER — Ambulatory Visit (INDEPENDENT_AMBULATORY_CARE_PROVIDER_SITE_OTHER): Payer: Medicaid Other | Admitting: Obstetrics and Gynecology

## 2012-01-22 ENCOUNTER — Other Ambulatory Visit: Payer: Medicaid Other

## 2012-01-22 VITALS — BP 92/68 | Wt 135.0 lb

## 2012-01-22 DIAGNOSIS — Z331 Pregnant state, incidental: Secondary | ICD-10-CM | POA: Insufficient documentation

## 2012-01-22 DIAGNOSIS — Z1389 Encounter for screening for other disorder: Secondary | ICD-10-CM

## 2012-01-22 LAB — US OB COMP + 14 WK

## 2012-01-22 NOTE — Progress Notes (Signed)
Denies quad screen. Lavera Guise, CNM

## 2012-01-22 NOTE — Progress Notes (Signed)
Patient ID: Abigail Hanson, female   DOB: 01/19/1986, 26 y.o.   MRN: [id number] Couth for a few weeks not using any meds seems to be getting better Lungs clear bilaterally, easy respiration, HEENT wnl Ultrasound anatomy WNL hypercolied cord placenta posterior, EDC agrees with dates URI resolving P OTC meds discussed Lavera Guise, CNM

## 2012-01-24 ENCOUNTER — Encounter: Payer: Medicaid Other | Admitting: Obstetrics and Gynecology

## 2012-02-19 ENCOUNTER — Ambulatory Visit (INDEPENDENT_AMBULATORY_CARE_PROVIDER_SITE_OTHER): Payer: Medicaid Other | Admitting: Obstetrics and Gynecology

## 2012-02-19 VITALS — BP 100/66 | Wt 143.0 lb

## 2012-02-19 DIAGNOSIS — O09219 Supervision of pregnancy with history of pre-term labor, unspecified trimester: Secondary | ICD-10-CM

## 2012-02-19 DIAGNOSIS — Z8759 Personal history of other complications of pregnancy, childbirth and the puerperium: Secondary | ICD-10-CM

## 2012-02-19 DIAGNOSIS — Z8742 Personal history of other diseases of the female genital tract: Secondary | ICD-10-CM

## 2012-02-19 DIAGNOSIS — Z98891 History of uterine scar from previous surgery: Secondary | ICD-10-CM

## 2012-02-19 DIAGNOSIS — Z9889 Other specified postprocedural states: Secondary | ICD-10-CM

## 2012-02-19 DIAGNOSIS — O09299 Supervision of pregnancy with other poor reproductive or obstetric history, unspecified trimester: Secondary | ICD-10-CM

## 2012-02-19 NOTE — Progress Notes (Signed)
[redacted]w[redacted]d Needs 1hr gtt NV No c/o  Plans VBAC - consent signed, 12/25/11 rv'd PTL sx's Will discuss w MD re: f/u for hypercoiled cord RTO 4wks

## 2012-02-19 NOTE — Patient Instructions (Signed)
Preventing Preterm Labor Preterm labor is when a pregnant woman has contractions that cause the cervix to open, shorten, and thin before 37 weeks of pregnancy. You will have regular contractions (tightening) 2 to 3 minutes apart. This usually causes discomfort or pain. HOME CARE  Eat a healthy diet.   Take your vitamins as told by your doctor.   Drink enough fluids to keep your pee (urine) clear or pale yellow every day.   Get rest and sleep.   Do not have sex if you are at high risk for preterm labor.   Follow your doctor's advice about activity, medicines, and tests.   Avoid stress.   Avoid hard labor or exercise that lasts for a long time.   Do not smoke.  GET HELP RIGHT AWAY IF:   You are having contractions.   You have belly (abdominal) pain.   You have bleeding from your vagina.   You have pain when you pee (urinate).   You have abnormal discharge from your vagina.   You have a temperature by mouth above 102 F (38.9 C).  MAKE SURE YOU:  Understand these instructions.   Will watch your condition.   Will get help if you are not doing well or get worse.  Document Released: 12/07/2008 Document Revised: 08/30/2011 Document Reviewed: 12/07/2008 ExitCare Patient Information 2012 [employer]. 

## 2012-02-19 NOTE — Progress Notes (Signed)
Pt has no complaints

## 2012-02-20 DIAGNOSIS — Z8759 Personal history of other complications of pregnancy, childbirth and the puerperium: Secondary | ICD-10-CM | POA: Insufficient documentation

## 2012-02-20 DIAGNOSIS — O09899 Supervision of other high risk pregnancies, unspecified trimester: Secondary | ICD-10-CM | POA: Insufficient documentation

## 2012-02-20 DIAGNOSIS — Z98891 History of uterine scar from previous surgery: Secondary | ICD-10-CM | POA: Insufficient documentation

## 2012-02-20 DIAGNOSIS — O09299 Supervision of pregnancy with other poor reproductive or obstetric history, unspecified trimester: Secondary | ICD-10-CM | POA: Insufficient documentation

## 2012-03-03 ENCOUNTER — Encounter: Payer: Self-pay | Admitting: Obstetrics and Gynecology

## 2012-03-03 NOTE — Progress Notes (Signed)
Per AR, rec growth [place] q4wks for hypercoiled cord. Plan [place] NV

## 2012-03-03 NOTE — Progress Notes (Signed)
Addended by: Malissa Hippo. on: 03/03/2012 08:17 PM   Modules accepted: Orders

## 2012-03-04 ENCOUNTER — Telehealth: Payer: Self-pay | Admitting: Obstetrics and Gynecology

## 2012-03-04 ENCOUNTER — Other Ambulatory Visit: Payer: Self-pay | Admitting: Obstetrics and Gynecology

## 2012-03-04 NOTE — Telephone Encounter (Signed)
Message copied by Mason Jim on Tue Mar 04, 2012 10:53 AM ------      Message from: Malissa Hippo.      Created: Mon Mar 03, 2012  8:18 PM      Regarding: pt needs growth [place]       Please schedule growth [place] at pt's upcoming visit.

## 2012-03-04 NOTE — Telephone Encounter (Signed)
TC to pt.   Informed of U/S scheduled per SL. Pt verbalizes comprehension.

## 2012-03-10 ENCOUNTER — Telehealth: Payer: Self-pay | Admitting: Obstetrics and Gynecology

## 2012-03-10 NOTE — Telephone Encounter (Signed)
ATTEMPTED CALL TO PT TO SCHED CARDIOLOGY REFERRAL.  THE NUMBERS LISTED WERE NOT IN SERVICE.  PT HAS APPT ON 03/18/12, REFERRAL TO BE ADDRESSED @ THAT VISIT.

## 2012-03-18 ENCOUNTER — Ambulatory Visit (INDEPENDENT_AMBULATORY_CARE_PROVIDER_SITE_OTHER): Payer: Medicaid Other | Admitting: Obstetrics and Gynecology

## 2012-03-18 ENCOUNTER — Other Ambulatory Visit: Payer: Self-pay | Admitting: Obstetrics and Gynecology

## 2012-03-18 ENCOUNTER — Other Ambulatory Visit: Payer: Medicaid Other

## 2012-03-18 ENCOUNTER — Ambulatory Visit (INDEPENDENT_AMBULATORY_CARE_PROVIDER_SITE_OTHER): Payer: Medicaid Other

## 2012-03-18 ENCOUNTER — Encounter: Payer: Self-pay | Admitting: Obstetrics and Gynecology

## 2012-03-18 VITALS — BP 104/60 | Wt 151.0 lb

## 2012-03-18 DIAGNOSIS — Z331 Pregnant state, incidental: Secondary | ICD-10-CM

## 2012-03-18 LAB — CBC
MCHC: 34.1 g/dL (ref 30.0–36.0)
RDW: 12.8 % (ref 11.5–15.5)

## 2012-03-18 NOTE — Progress Notes (Signed)
Glucola, RPR, hemoglobin today. Ultrasound: Normal fluid, single gestation, vertex, cervix 3.19 cm, 29 weeks (76 percentile), hypocoiled umbilical cord. Tubal papers were previously signed. Blood type O positive. Patient wants a vaginal birth after cesarean section. Return to office in 2 weeks. Dr. Stefano Gaul

## 2012-03-18 NOTE — Progress Notes (Signed)
1 hr GTT given today

## 2012-03-19 LAB — GLUCOSE TOLERANCE, 1 HOUR

## 2012-03-20 ENCOUNTER — Telehealth: Payer: Self-pay | Admitting: Obstetrics and Gynecology

## 2012-03-20 NOTE — Telephone Encounter (Signed)
Spoke with pt rgd concerns pt c/o being dizzy and light headed with occ tingling in hands no spotting no bleeding positive fetal movement pt only had a bowl of cereal and two cups of water for breakfast advised pt increase water intake eat something with some protein take it easy when moving don't move so swiftly in no better or get worse call office for eval pt voice understanding

## 2012-03-20 NOTE — Telephone Encounter (Signed)
Niccole/incoming ob msg.

## 2012-03-21 LAB — US OB FOLLOW UP

## 2012-04-02 ENCOUNTER — Ambulatory Visit (INDEPENDENT_AMBULATORY_CARE_PROVIDER_SITE_OTHER): Payer: Medicaid Other | Admitting: Obstetrics and Gynecology

## 2012-04-02 ENCOUNTER — Encounter: Payer: Self-pay | Admitting: Obstetrics and Gynecology

## 2012-04-02 DIAGNOSIS — O09899 Supervision of other high risk pregnancies, unspecified trimester: Secondary | ICD-10-CM

## 2012-04-02 DIAGNOSIS — O09219 Supervision of pregnancy with history of pre-term labor, unspecified trimester: Secondary | ICD-10-CM

## 2012-04-02 DIAGNOSIS — Z331 Pregnant state, incidental: Secondary | ICD-10-CM

## 2012-04-02 NOTE — Progress Notes (Signed)
[redacted]w[redacted]d Children at visit today and pt was very distracted Plans NCB, would like to avoid IV if possible, explained my rec for SL, pt agreeable  Long discussion regarding not wanting interventions  rv'd PTL sx's and FKC  And rv'd norm 1hr gtt rv'd recommendation for growth [place] every 4wks secondary to hypercoiled cord, per AR. Plan [place]

## 2012-04-02 NOTE — Patient Instructions (Signed)
Preterm Labor Preterm labor is when labor starts at less than 37 weeks of pregnancy. The normal length of a pregnancy is 39 to 41 weeks. CAUSES Often, there is no identifiable underlying cause as to why a woman goes into preterm labor. However, one of the most common known causes of preterm labor is infection. Infections of the uterus, cervix, vagina, amniotic sac, bladder, kidney, or even the lungs (pneumonia) can cause labor to start. Other causes of preterm labor include:  Urogenital infections, such as yeast infections and bacterial vaginosis.   Uterine abnormalities (uterine shape, uterine septum, fibroids, bleeding from the placenta).   A cervix that has been operated on and opens prematurely.   Malformations in the baby.   Multiple gestations (twins, triplets, and so on).   Breakage of the amniotic sac.  Additional risk factors for preterm labor include:  Previous history of preterm labor.   Premature rupture of membranes (PROM).   A placenta that covers the opening of the cervix (placenta previa).   A placenta that separates from the uterus (placenta abruption).   A cervix that is too weak to hold the baby in the uterus (incompetence cervix).   Having too much fluid in the amniotic sac (polyhydramnios).   Taking illegal drugs or smoking while pregnant.   Not gaining enough weight while pregnant.   Women younger than 18 and older than 27 years old.   Low socioeconomic status.   African-American ethnicity.  SYMPTOMS Signs and symptoms of preterm labor include:  Menstrual-like cramps.   Contractions that are 30 to 70 seconds apart, become very regular, closer together, and are more intense and painful.   Contractions that start on the top of the uterus and spread down to the lower abdomen and back.   A sense of increased pelvic pressure or back pain.   A watery or bloody discharge that comes from the vagina.  DIAGNOSIS  A diagnosis can be confirmed by:  A  vaginal exam.   An ultrasound of the cervix.   Sampling (swabbing) cervico-vaginal secretions. These samples can be tested for the presence of fetal fibronectin. This is a protein found in cervical discharge which is associated with preterm labor.   Fetal monitoring.  TREATMENT  Depending on the length of the pregnancy and other circumstances, a caregiver may suggest bed rest. If necessary, there are medicines that can be given to stop contractions and to quicken fetal lung maturity. If labor happens before 34 weeks of pregnancy, a prolonged hospital stay may be recommended. Treatment depends on the condition of both the mother and baby. PREVENTION There are some things a mother can do to lower the risk of preterm labor in future pregnancies. A woman can:   Stop smoking.   Maintain healthy weight gain and avoid chemicals and drugs that are not necessary.   Be watchful for any type of infection.   Inform her caregiver if she has a known history of preterm labor.  Document Released: 12/01/2003 Document Revised: 08/30/2011 Document Reviewed: 01/05/2011 ExitCare Patient Information 2012 [employer].Fetal Movement Counts Patient Name: __________________________________________________ Patient Due Date: ____________________ Kick counts is highly recommended in high risk pregnancies, but it is a good idea for every pregnant woman to do. Start counting fetal movements at 28 weeks of the pregnancy. Fetal movements increase after eating a full meal or eating or drinking something sweet (the blood sugar is higher). It is also important to drink plenty of fluids (well hydrated) before doing the count. Lie   on your left side because it helps with the circulation or you can sit in a comfortable chair with your arms over your belly (abdomen) with no distractions around you. DOING THE COUNT  Try to do the count the same time of day each time you do it.   Mark the day and time, then see how long it  takes for you to feel 10 movements (kicks, flutters, swishes, rolls). You should have at least 10 movements within 2 hours. You will most likely feel 10 movements in much less than 2 hours. If you do not, wait an hour and count again. After a couple of days you will see a pattern.   What you are looking for is a change in the pattern or not enough counts in 2 hours. Is it taking longer in time to reach 10 movements?  SEEK MEDICAL CARE IF:  You feel less than 10 counts in 2 hours. Tried twice.   No movement in one hour.   The pattern is changing or taking longer each day to reach 10 counts in 2 hours.   You feel the baby is not moving as it usually does.  Date: ____________ Movements: ____________ Start time: ____________ Finish time: ____________  Date: ____________ Movements: ____________ Start time: ____________ Finish time: ____________ Date: ____________ Movements: ____________ Start time: ____________ Finish time: ____________ Date: ____________ Movements: ____________ Start time: ____________ Finish time: ____________ Date: ____________ Movements: ____________ Start time: ____________ Finish time: ____________ Date: ____________ Movements: ____________ Start time: ____________ Finish time: ____________ Date: ____________ Movements: ____________ Start time: ____________ Finish time: ____________ Date: ____________ Movements: ____________ Start time: ____________ Finish time: ____________  Date: ____________ Movements: ____________ Start time: ____________ Finish time: ____________ Date: ____________ Movements: ____________ Start time: ____________ Finish time: ____________ Date: ____________ Movements: ____________ Start time: ____________ Finish time: ____________ Date: ____________ Movements: ____________ Start time: ____________ Finish time: ____________ Date: ____________ Movements: ____________ Start time: ____________ Finish time: ____________ Date: ____________ Movements:  ____________ Start time: ____________ Finish time: ____________ Date: ____________ Movements: ____________ Start time: ____________ Finish time: ____________  Date: ____________ Movements: ____________ Start time: ____________ Finish time: ____________ Date: ____________ Movements: ____________ Start time: ____________ Finish time: ____________ Date: ____________ Movements: ____________ Start time: ____________ Finish time: ____________ Date: ____________ Movements: ____________ Start time: ____________ Finish time: ____________ Date: ____________ Movements: ____________ Start time: ____________ Finish time: ____________ Date: ____________ Movements: ____________ Start time: ____________ Finish time: ____________ Date: ____________ Movements: ____________ Start time: ____________ Finish time: ____________  Date: ____________ Movements: ____________ Start time: ____________ Finish time: ____________ Date: ____________ Movements: ____________ Start time: ____________ Finish time: ____________ Date: ____________ Movements: ____________ Start time: ____________ Finish time: ____________ Date: ____________ Movements: ____________ Start time: ____________ Finish time: ____________ Date: ____________ Movements: ____________ Start time: ____________ Finish time: ____________ Date: ____________ Movements: ____________ Start time: ____________ Finish time: ____________ Date: ____________ Movements: ____________ Start time: ____________ Finish time: ____________  Date: ____________ Movements: ____________ Start time: ____________ Finish time: ____________ Date: ____________ Movements: ____________ Start time: ____________ Finish time: ____________ Date: ____________ Movements: ____________ Start time: ____________ Finish time: ____________ Date: ____________ Movements: ____________ Start time: ____________ Finish time: ____________ Date: ____________ Movements: ____________ Start time: ____________ Finish  time: ____________ Date: ____________ Movements: ____________ Start time: ____________ Finish time: ____________ Date: ____________ Movements: ____________ Start time: ____________ Finish time: ____________  Date: ____________ Movements: ____________ Start time: ____________ Finish time: ____________ Date: ____________ Movements: ____________ Start time: ____________ Finish time: ____________ Date: ____________ Movements: ____________ Start time: ____________ Finish time: ____________   Date: ____________ Movements: ____________ Start time: ____________ Finish time: ____________ Date: ____________ Movements: ____________ Start time: ____________ Finish time: ____________ Date: ____________ Movements: ____________ Start time: ____________ Finish time: ____________ Date: ____________ Movements: ____________ Start time: ____________ Finish time: ____________  Date: ____________ Movements: ____________ Start time: ____________ Finish time: ____________ Date: ____________ Movements: ____________ Start time: ____________ Finish time: ____________ Date: ____________ Movements: ____________ Start time: ____________ Finish time: ____________ Date: ____________ Movements: ____________ Start time: ____________ Finish time: ____________ Date: ____________ Movements: ____________ Start time: ____________ Finish time: ____________ Date: ____________ Movements: ____________ Start time: ____________ Finish time: ____________ Date: ____________ Movements: ____________ Start time: ____________ Finish time: ____________  Date: ____________ Movements: ____________ Start time: ____________ Finish time: ____________ Date: ____________ Movements: ____________ Start time: ____________ Finish time: ____________ Date: ____________ Movements: ____________ Start time: ____________ Finish time: ____________ Date: ____________ Movements: ____________ Start time: ____________ Finish time: ____________ Date: ____________  Movements: ____________ Start time: ____________ Finish time: ____________ Date: ____________ Movements: ____________ Start time: ____________ Finish time: ____________ Document Released: 10/10/2006 Document Revised: 08/30/2011 Document Reviewed: 04/12/2009 ExitCare Patient Information 2012 [employer]. 

## 2012-04-02 NOTE — Progress Notes (Signed)
1 gtt 91 RPR NR CBC WNL No concerns per pt

## 2012-04-16 ENCOUNTER — Ambulatory Visit (INDEPENDENT_AMBULATORY_CARE_PROVIDER_SITE_OTHER): Payer: Medicaid Other | Admitting: Obstetrics and Gynecology

## 2012-04-16 ENCOUNTER — Ambulatory Visit (INDEPENDENT_AMBULATORY_CARE_PROVIDER_SITE_OTHER): Payer: Medicaid Other

## 2012-04-16 ENCOUNTER — Other Ambulatory Visit: Payer: Self-pay | Admitting: Obstetrics and Gynecology

## 2012-04-16 VITALS — BP 90/58 | Wt 159.0 lb

## 2012-04-16 DIAGNOSIS — Z331 Pregnant state, incidental: Secondary | ICD-10-CM

## 2012-04-16 DIAGNOSIS — Z3490 Encounter for supervision of normal pregnancy, unspecified, unspecified trimester: Secondary | ICD-10-CM

## 2012-04-16 LAB — US OB FOLLOW UP

## 2012-04-16 NOTE — Progress Notes (Signed)
[redacted]w[redacted]d USS: Growth and Hypo coiled           Anatomy Normal            Cx closed measuring 2.7cms           Presentation: Vx           Placenta - Anterior           AFI = 65th% normal No complaints and no change in vaginal secretions ROB x 2 weeks

## 2012-04-16 NOTE — Progress Notes (Signed)
No concerns per pt 

## 2012-04-30 ENCOUNTER — Encounter: Payer: Self-pay | Admitting: Obstetrics and Gynecology

## 2012-04-30 ENCOUNTER — Ambulatory Visit (INDEPENDENT_AMBULATORY_CARE_PROVIDER_SITE_OTHER): Payer: Medicaid Other | Admitting: Obstetrics and Gynecology

## 2012-04-30 VITALS — BP 90/50 | Wt 162.0 lb

## 2012-04-30 DIAGNOSIS — Z331 Pregnant state, incidental: Secondary | ICD-10-CM

## 2012-04-30 NOTE — Progress Notes (Signed)
C/o: pt thinks where she had belly piearcing might be infected.

## 2012-04-30 NOTE — Progress Notes (Signed)
Patient ID: Abigail Hanson, female   DOB: 12/09/1985, 26 y.o.   MRN: [id number] C/o of feeling tired spouse in school, discussed nutrition, water. Reviewed s/s preterm labor, srom, vag bleeding,daily kick counts to report, encouraged 8 water daily and frequent voids. Lavera Guise, CNM

## 2012-05-01 ENCOUNTER — Telehealth: Payer: Self-pay | Admitting: Obstetrics and Gynecology

## 2012-05-01 NOTE — Telephone Encounter (Signed)
TRIAGE/OB °

## 2012-05-01 NOTE — Telephone Encounter (Signed)
Left message for pt to return call. Abigail Hanson

## 2012-05-12 ENCOUNTER — Encounter: Payer: Self-pay | Admitting: Obstetrics and Gynecology

## 2012-05-12 ENCOUNTER — Ambulatory Visit (INDEPENDENT_AMBULATORY_CARE_PROVIDER_SITE_OTHER): Payer: Medicaid Other | Admitting: Obstetrics and Gynecology

## 2012-05-12 ENCOUNTER — Other Ambulatory Visit: Payer: Self-pay | Admitting: Emergency Medicine

## 2012-05-12 VITALS — BP 98/56 | Wt 165.0 lb

## 2012-05-12 DIAGNOSIS — Z331 Pregnant state, incidental: Secondary | ICD-10-CM

## 2012-05-12 DIAGNOSIS — L089 Local infection of the skin and subcutaneous tissue, unspecified: Secondary | ICD-10-CM

## 2012-05-12 MED ORDER — CEPHALEXIN 500 MG PO CAPS: 500.0000 mg | ORAL_CAPSULE | Freq: Two times a day (BID) | ORAL | Status: AC

## 2012-05-12 NOTE — Progress Notes (Signed)
Doing well, except for local infection of previous belly button piercing. Has been doing Neosporin and warm soaks, but still tender and red. No drainage. Rx Keflex 500 mg po BID x 7 days. GBS today. Plan [place] at NV for growth and fluid due to hypercoiled cord.

## 2012-05-12 NOTE — Progress Notes (Signed)
Pt c/o belly button infection Freq. HA's with floaties occasional dizziness x's 2 wks  C/o SOB while walking

## 2012-05-14 LAB — STREP B DNA PROBE: GBSP: NEGATIVE

## 2012-05-19 ENCOUNTER — Ambulatory Visit (INDEPENDENT_AMBULATORY_CARE_PROVIDER_SITE_OTHER): Payer: Medicaid Other | Admitting: Obstetrics and Gynecology

## 2012-05-19 ENCOUNTER — Ambulatory Visit (INDEPENDENT_AMBULATORY_CARE_PROVIDER_SITE_OTHER): Payer: Medicaid Other

## 2012-05-19 ENCOUNTER — Encounter: Payer: Self-pay | Admitting: Obstetrics and Gynecology

## 2012-05-19 VITALS — BP 90/58 | Wt 163.0 lb

## 2012-05-19 DIAGNOSIS — B379 Candidiasis, unspecified: Secondary | ICD-10-CM

## 2012-05-19 DIAGNOSIS — B49 Unspecified mycosis: Secondary | ICD-10-CM

## 2012-05-19 LAB — US OB FOLLOW UP

## 2012-05-19 MED ORDER — TERCONAZOLE 0.4 % VA CREA: 1.0000 | TOPICAL_CREAM | Freq: Every day | VAGINAL | Status: AC

## 2012-05-19 NOTE — Progress Notes (Signed)
GBS negative. C/O itching of vagina last night. Wet prep yeast Rx Terazol 7. [place] today:  Vtx, EFW 6+13, 78%ile AFI 16.6, 80%ile. Korea at 40 weeks if not delivered. [place] at 40 weeks if not delivered

## 2012-05-19 NOTE — Progress Notes (Signed)
C/o yeast infection recent antibiotic use no relief with Monistat

## 2012-05-25 ENCOUNTER — Telehealth: Payer: Self-pay | Admitting: Obstetrics and Gynecology

## 2012-05-25 NOTE — Telephone Encounter (Signed)
TC from pt c/o N/V/D since she woke up at 5am, has not had any food since about 8pm, and vomited after drinking water. She c/o pelvic pressure, but denies any ctx, VB, LOF, reports GFM. I called in RX for zofran 8mg  ODT q8prn, phenergan 25mg  PO q6prn and phenergan suppository 25mg  q6prn, also instructed pt to take OTC immodium. Try gingerale/sprite, and no solid food until after taking rx's. If no improvement call again, or may come to [facility] for eval. Rv'd FKC and labor sx's. Rx's called to [facility] on [address].

## 2012-05-29 ENCOUNTER — Encounter: Payer: Self-pay | Admitting: Obstetrics and Gynecology

## 2012-05-29 ENCOUNTER — Encounter: Payer: Medicaid Other | Admitting: Obstetrics and Gynecology

## 2012-05-29 ENCOUNTER — Ambulatory Visit (INDEPENDENT_AMBULATORY_CARE_PROVIDER_SITE_OTHER): Payer: Medicaid Other | Admitting: Obstetrics and Gynecology

## 2012-05-29 VITALS — BP 90/54 | Wt 164.0 lb

## 2012-05-29 DIAGNOSIS — D649 Anemia, unspecified: Secondary | ICD-10-CM

## 2012-05-29 DIAGNOSIS — Z3490 Encounter for supervision of normal pregnancy, unspecified, unspecified trimester: Secondary | ICD-10-CM

## 2012-05-29 DIAGNOSIS — Z331 Pregnant state, incidental: Secondary | ICD-10-CM

## 2012-05-29 LAB — CBC
MCV: 85.1 fL (ref 78.0–100.0)
Platelets: 172 10*3/uL (ref 150–400)
RBC: 4.03 MIL/uL (ref 3.87–5.11)
WBC: 6.5 10*3/uL (ref 4.0–10.5)

## 2012-05-29 NOTE — Progress Notes (Signed)
Had 24 hr stomach virus over the weekend. Sx's improved C/o R side low abdominal pain radiating down to R leg thigh

## 2012-05-29 NOTE — Addendum Note (Signed)
Addended by: Janeece Agee on: 05/29/2012 02:27 PM   Modules accepted: Orders

## 2012-05-29 NOTE — Progress Notes (Signed)
[redacted]w[redacted]d Looks pale  CBC today for r/o anemia FM+ No change vaginal secretions ROB x 1 week

## 2012-06-04 ENCOUNTER — Ambulatory Visit (INDEPENDENT_AMBULATORY_CARE_PROVIDER_SITE_OTHER): Payer: Medicaid Other | Admitting: Obstetrics and Gynecology

## 2012-06-04 ENCOUNTER — Encounter: Payer: Self-pay | Admitting: Obstetrics and Gynecology

## 2012-06-04 VITALS — BP 94/62 | Wt 163.0 lb

## 2012-06-04 DIAGNOSIS — Z3490 Encounter for supervision of normal pregnancy, unspecified, unspecified trimester: Secondary | ICD-10-CM

## 2012-06-04 DIAGNOSIS — Z331 Pregnant state, incidental: Secondary | ICD-10-CM

## 2012-06-04 NOTE — Progress Notes (Signed)
[redacted]w[redacted]d no complaints.  Request cervix check.

## 2012-06-04 NOTE — Progress Notes (Signed)
Doing well. Reviewed skin to skin process at [facility]. Cervix 2, 80%, vtx, -1, slightly posterior, soft. [place] NV for fluid and growth due to hypercoiled cord. Plans placental encapsulation.

## 2012-06-05 ENCOUNTER — Inpatient Hospital Stay (HOSPITAL_COMMUNITY)
Admission: AD | Admit: 2012-06-05 | Discharge: 2012-06-07 | DRG: 775 | Disposition: A | Discharge status: Home / Self Care | Payer: Medicaid Other | Source: Ambulatory Visit | Attending: Obstetrics and Gynecology | Admitting: Obstetrics and Gynecology

## 2012-06-05 ENCOUNTER — Encounter (HOSPITAL_COMMUNITY): Payer: Self-pay | Admitting: *Deleted

## 2012-06-05 ENCOUNTER — Telehealth: Payer: Self-pay | Admitting: Obstetrics and Gynecology

## 2012-06-05 DIAGNOSIS — Z98891 History of uterine scar from previous surgery: Secondary | ICD-10-CM

## 2012-06-05 DIAGNOSIS — D649 Anemia, unspecified: Secondary | ICD-10-CM | POA: Diagnosis not present

## 2012-06-05 DIAGNOSIS — O09899 Supervision of other high risk pregnancies, unspecified trimester: Secondary | ICD-10-CM

## 2012-06-05 DIAGNOSIS — O09299 Supervision of pregnancy with other poor reproductive or obstetric history, unspecified trimester: Secondary | ICD-10-CM

## 2012-06-05 DIAGNOSIS — O4290 Premature rupture of membranes, unspecified as to length of time between rupture and onset of labor, unspecified weeks of gestation: Principal | ICD-10-CM | POA: Diagnosis present

## 2012-06-05 DIAGNOSIS — Z8759 Personal history of other complications of pregnancy, childbirth and the puerperium: Secondary | ICD-10-CM

## 2012-06-05 DIAGNOSIS — O34219 Maternal care for unspecified type scar from previous cesarean delivery: Secondary | ICD-10-CM | POA: Diagnosis not present

## 2012-06-05 DIAGNOSIS — O9903 Anemia complicating the puerperium: Secondary | ICD-10-CM | POA: Diagnosis not present

## 2012-06-05 LAB — CBC
Hemoglobin: 11.7 g/dL — ABNORMAL LOW (ref 12.0–15.0)
MCH: 27.9 pg (ref 26.0–34.0)
MCHC: 33.2 g/dL (ref 30.0–36.0)
MCV: 84 fL (ref 78.0–100.0)

## 2012-06-05 LAB — ABO/RH: ABO/RH(D): O POS

## 2012-06-05 LAB — PREPARE RBC (CROSSMATCH)

## 2012-06-05 MED ORDER — IBUPROFEN 600 MG PO TABS: 600.0000 mg | ORAL_TABLET | Freq: Four times a day (QID) | ORAL | Status: DC | PRN

## 2012-06-05 MED ORDER — ONDANSETRON HCL 4 MG/2ML IJ SOLN: 4.0000 mg | Freq: Four times a day (QID) | INTRAMUSCULAR | Status: DC | PRN

## 2012-06-05 MED ORDER — LIDOCAINE HCL (PF) 1 % IJ SOLN: 30.0000 mL | INTRAMUSCULAR | Status: DC | PRN

## 2012-06-05 MED ORDER — SODIUM CHLORIDE 0.9 % IJ SOLN: 3.0000 mL | Freq: Two times a day (BID) | INTRAMUSCULAR | Status: DC

## 2012-06-05 MED ORDER — OXYTOCIN BOLUS FROM INFUSION
500.0000 mL | Freq: Once | INTRAVENOUS | Status: DC
  Filled 2012-06-05: qty 500

## 2012-06-05 MED ORDER — SODIUM CHLORIDE 0.9 % IJ SOLN
3.0000 mL | Freq: Two times a day (BID) | INTRAMUSCULAR | Status: DC
  Administered 2012-06-06: 3 mL via INTRAVENOUS

## 2012-06-05 MED ORDER — CITRIC ACID-SODIUM CITRATE 334-500 MG/5ML PO SOLN: 30.0000 mL | ORAL | Status: DC | PRN

## 2012-06-05 MED ORDER — ACETAMINOPHEN 325 MG PO TABS: 650.0000 mg | ORAL_TABLET | ORAL | Status: DC | PRN

## 2012-06-05 MED ORDER — OXYCODONE-ACETAMINOPHEN 5-325 MG PO TABS: 1.0000 | ORAL_TABLET | ORAL | Status: DC | PRN

## 2012-06-05 MED ORDER — OXYTOCIN 40 UNITS IN LACTATED RINGERS INFUSION - SIMPLE MED
62.5000 mL/h | Freq: Once | INTRAVENOUS | Status: DC
  Filled 2012-06-05: qty 1000

## 2012-06-05 MED ORDER — LIDOCAINE HCL (PF) 1 % IJ SOLN
30.0000 mL | INTRAMUSCULAR | Status: DC | PRN
  Filled 2012-06-05: qty 30

## 2012-06-05 NOTE — Progress Notes (Addendum)
Call by RN to discuss plan of care. States contractions feel tight not hurting, not leaking enough to change pad O abd soft, gravid, nt    EFW 7#13 VTX per leopolds    UC mild Up on birthing ball and has requested to walk, monitor off and on and no IV. Discussed risks of VBAC per signed office consent, hemorrhage, death to mother and fetus, higher risk if emergency cesarean section of injury to organs, need for hysterectomy, risk of infection. Need for continuous EFM and IV access with saline lock, also blood draw. Husband at bedside and he and pt concur with plan of care. Lavera Guise, CNM

## 2012-06-05 NOTE — Progress Notes (Signed)
Comfortable, some pressure O BP 108/71  Pulse 63  Temp 98.7 F (37.1 C) (Oral)  Resp 16  Ht 5\' 2"  (1.575 m)  Wt 163 lb 9.6 oz (74.208 kg)  BMI 29.92 kg/m2  LMP 09/07/2011       fhts category 1      abd soft between uc      Contractions q 3-6 mild to mod      Vag 4-5 70 -1 VTX R A with cervical change P continue care Lavera Guise, CNM

## 2012-06-05 NOTE — MAU Note (Signed)
Patient states she had a sudden gush of clear fluid at 1420. Continues to leak fluid. Denies any pain and is unsure if the baby is moving normally or not.

## 2012-06-05 NOTE — Telephone Encounter (Signed)
Pt called and stated that her water had broken around 2:30 at the grocery store. Pt traveled back home and called the office. CNM DD was called and she advised that pt is to come to the hospital. Pt advised, voice understanding. No distress. Abigail Hanson

## 2012-06-05 NOTE — H&P (Signed)
Abigail Hanson is a 26 y.o. female, [phone] at [redacted]w[redacted]d   presenting for with Hx of SROM at 14.30 hrs while out shopping at the grocery store. Describes clear fluid running down legs and pooling at feet. The patient has had no pain or contraction associated with this event of SROM To MAU for evaluation of SROM.   Patient Active Problem List  Diagnosis  . Syncope  . Tachypalpitations  . Pregnancy as incidental finding  . History of cesarean delivery - 2nd preg  . History of VBAC - successful x2  . History of IUGR (intrauterine growth retardation) 2nd preg   . History of polyhydramnios - 2nd preg  . 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 Umbilical cord complication    History of present pregnancy: Patient entered care at 10 weeks at MAU with Faculty.Every anxious as had a baby pass at 71 y/o. This Baby was born with Multiple Abnormalities.  EDC of LMP and confirmed with USS as 11/14/11.  Anatomy scan was done at [redacted]w[redacted]d with normal findings and an anterior placenta But the USS showed hypo-coiled cord.  Further ultrasounds were done at [redacted]w[redacted]d, [redacted]w[redacted]d and [redacted]w[redacted]d for serial growth assessment for due to hypo- coiled cord. The growth range was between 76th - 80th % tile.  Her prenatal course was essentially uncomplicated.  At her last evalution, she was at [facility] 06/04/12.  OB History    Grav Para Term Preterm Abortions TAB SAB Ect Mult Living   5 4 2 2      3      Past Medical History  Diagnosis Date  . Pharyngitis   . Macrosomia   . Anxiety     no meds  . H/O varicella     as a child  . Ear infection     third grade overnight hospital stay.  . Concussion     Age 41 falling from horse  . History of chicken pox    Past Surgical History  Procedure Date  . Cesarean section    Family History: family history includes Cancer in her maternal grandmother; Heart disease in her father; Hyperlipidemia in her  father; and Hypertension in an unspecified family member.  There is no history of Anesthesia problems. Social History:  reports that she has never smoked. She has never used smokeless tobacco. She reports that she does not drink alcohol or use illicit drugs.  ROS:  Pale , 26 y/o caucasian women with SRoM at [redacted]w[redacted]d Admit for management of Labor  No Known Allergies   Dilation: 3 Effacement (%): 80 Station: -1 Exam by:: Abigail Hanson,CNM Blood pressure 108/71, pulse 80, temperature 98.7 F (37.1 C), temperature source Oral, resp. rate 16, height 5\' 2"  (1.575 m), weight 163 lb 9.6 oz (74.208 kg), last menstrual period 09/07/2011.  Chest clear Heart RRR without murmur Abd gravid, NT, FH to dates Pelvic: adequate Ext: normal  FHR: 135 bpm cat 1 tracing UCs:  Uterine irritability only  Prenatal labs: ABO, Rh:  O pos+ Antibody:  neg Rubella:   Immune RPR: NON REAC (06/25 1051)  HBsAg:   neg HIV:   neg GBS: NEGATIVE (08/19 1210) Sickle cell/Hgb electrophoresis:  neg Pap: last pap 2010 - normal GC:  neg Chlamydia:  neg Genetic screenings:  none Glucola:  normal      Assessment/Plan: IUP at [redacted]w[redacted]d, Hx of SROM at 14.30hrs today. No contractions, Confirmed SROM with Clinical  observation of pooling and Nitrazine - positive. SVE: 3/*0%/-1, Membranes on front of the Vx but leaking clear fluid PV. Patient has requested to ambulate to try to initiate labor contractions and use birthing ball. She wishes to also use nipple stimulation to produce natural oxytocin. She has requested intermittent monitoring.  She wishes to have a natural course and deliver in the squatting position if possible.   Plan: Admit to [employer]. Allow to ambulate and use Abigail Hanson with intermittent monitoring. Nipple stimulation for oxytocin release. SROM 14.30 hrs. This is a "Girl" baby.  Abigail Hanson, Abigail Hanson, [place] 06/05/2012, 5:24 PM

## 2012-06-05 NOTE — Telephone Encounter (Signed)
PAM/OB WATER BROKE

## 2012-06-06 ENCOUNTER — Encounter (HOSPITAL_COMMUNITY): Payer: Self-pay | Admitting: *Deleted

## 2012-06-06 DIAGNOSIS — O34219 Maternal care for unspecified type scar from previous cesarean delivery: Secondary | ICD-10-CM

## 2012-06-06 LAB — RPR: RPR Ser Ql: NONREACTIVE

## 2012-06-06 MED ORDER — IBUPROFEN 600 MG PO TABS
600.0000 mg | ORAL_TABLET | Freq: Four times a day (QID) | ORAL | Status: DC
  Administered 2012-06-06 – 2012-06-07 (×4): 600 mg via ORAL
  Filled 2012-06-06 (×4): qty 1

## 2012-06-06 MED ORDER — PRENATAL MULTIVITAMIN CH: 1.0000 | ORAL_TABLET | Freq: Every day | ORAL | Status: DC

## 2012-06-06 MED ORDER — OXYCODONE-ACETAMINOPHEN 5-325 MG PO TABS: 1.0000 | ORAL_TABLET | ORAL | Status: DC | PRN

## 2012-06-06 MED ORDER — TERBUTALINE SULFATE 1 MG/ML IJ SOLN: 0.2500 mg | Freq: Once | INTRAMUSCULAR | Status: DC | PRN

## 2012-06-06 MED ORDER — LACTATED RINGERS IV SOLN: INTRAVENOUS | Status: DC

## 2012-06-06 MED ORDER — DIBUCAINE 1 % RE OINT: 1.0000 "application " | TOPICAL_OINTMENT | RECTAL | Status: DC | PRN

## 2012-06-06 MED ORDER — WITCH HAZEL-GLYCERIN EX PADS: 1.0000 "application " | MEDICATED_PAD | CUTANEOUS | Status: DC | PRN

## 2012-06-06 MED ORDER — TETANUS-DIPHTH-ACELL PERTUSSIS 5-2.5-18.5 LF-MCG/0.5 IM SUSP
0.5000 mL | Freq: Once | INTRAMUSCULAR | Status: AC
  Administered 2012-06-07: 0.5 mL via INTRAMUSCULAR

## 2012-06-06 MED ORDER — BENZOCAINE-MENTHOL 20-0.5 % EX AERO
1.0000 "application " | INHALATION_SPRAY | CUTANEOUS | Status: DC | PRN
  Administered 2012-06-06: 1 via TOPICAL
  Filled 2012-06-06: qty 56

## 2012-06-06 MED ORDER — ONDANSETRON HCL 4 MG/2ML IJ SOLN: 4.0000 mg | INTRAMUSCULAR | Status: DC | PRN

## 2012-06-06 MED ORDER — SIMETHICONE 80 MG PO CHEW: 80.0000 mg | CHEWABLE_TABLET | ORAL | Status: DC | PRN

## 2012-06-06 MED ORDER — DIPHENHYDRAMINE HCL 25 MG PO CAPS: 25.0000 mg | ORAL_CAPSULE | Freq: Four times a day (QID) | ORAL | Status: DC | PRN

## 2012-06-06 MED ORDER — INFLUENZA VIRUS VACC SPLIT PF IM SUSP
0.5000 mL | INTRAMUSCULAR | Status: AC
  Administered 2012-06-07: 0.5 mL via INTRAMUSCULAR
  Filled 2012-06-06: qty 0.5

## 2012-06-06 MED ORDER — ONDANSETRON HCL 4 MG PO TABS: 4.0000 mg | ORAL_TABLET | ORAL | Status: DC | PRN

## 2012-06-06 MED ORDER — LANOLIN HYDROUS EX OINT: TOPICAL_OINTMENT | CUTANEOUS | Status: DC | PRN

## 2012-06-06 MED ORDER — ZOLPIDEM TARTRATE 5 MG PO TABS: 5.0000 mg | ORAL_TABLET | Freq: Every evening | ORAL | Status: DC | PRN

## 2012-06-06 MED ORDER — SENNOSIDES-DOCUSATE SODIUM 8.6-50 MG PO TABS
2.0000 | ORAL_TABLET | Freq: Every day | ORAL | Status: DC
  Administered 2012-06-06: 2 via ORAL

## 2012-06-06 MED ORDER — OXYTOCIN 40 UNITS IN LACTATED RINGERS INFUSION - SIMPLE MED
1.0000 m[IU]/min | INTRAVENOUS | Status: DC
  Administered 2012-06-06: 1 m[IU]/min via INTRAVENOUS

## 2012-06-06 NOTE — Progress Notes (Signed)
Comfortable "could sleep maybe pitocin later" O  fhts category 1      abd soft between uc mild to mod      Contractions q 2-8      Vag not assessed A probable inadequate uc P will request vital signs, discussed pitocin augmentation, continue care Lavera Guise, CNM

## 2012-06-06 NOTE — Progress Notes (Signed)
Calm, quiet with uc "tailbone sore et thighs with uc" O VSS      fhts 110s LTV mod accels      abd soft between uc      Contractions q 5 mod      Vag not assessed A [redacted]w[redacted]d     SROM x 12 hours P continue care Lavera Guise, CNM

## 2012-06-06 NOTE — Progress Notes (Signed)
Tailbone hurts, teary tired, requests vag exam O VSS      fhts category 1      abd soft between uc q 2-3       Contractions mod      Vag 8 100 -1 VTX no capit no molding R A  Active labor      39 Week IUP VBAC P to knee chest position, continue care Lavera Guise, CNM

## 2012-06-06 NOTE — Progress Notes (Signed)
  Subjective: Tired, no change in contractions with use of breast pump.  Objective: BP 109/58  Pulse 59  Temp 97.5 F (36.4 C) (Axillary)  Resp 18  Ht 5\' 2"  (1.575 m)  Wt 163 lb 9.6 oz (74.208 kg)  BMI 29.92 kg/m2  LMP 09/07/2011      FHT:  Category 1 UC:   q 8-10 min, moderate, no change since last eval  Assessment / Plan: Inadequate labor  Reviewed lack of progress with patient and husband. I recommend pitocin per low dose protocol. Patient's husband expressed frustration/anger with situation,  questioning whether plan of care was being driven by need for the labor room to be vacated, or by staff in collusion with drug companies for use of medication ("conspiracy"). Issues reviewed, reassured husband and patient my recommendations were based on evidenced-based practice and experience, focused only on the well-being of mother and baby. Reviewed importance of informed consent, with focus on provider's responsibility to communicate all options, with R&B of each discussed (continued observation of labor status vs. Augmentation with pitocin). After patient and husband had time to discuss situation, they now elect to proceed with pitocin augmentation. Will start low-dose pitocin augmentation. Support to patient and husband for concerns and issues. Nigel Bridgeman 06/06/2012, 1:27 PM

## 2012-06-06 NOTE — Progress Notes (Signed)
Dr. Su Hilt updated on vag at 1800 and 2215 pt asleep, declines pitocin augmentation, Continue care. Lavera Guise, CNM

## 2012-06-06 NOTE — Progress Notes (Signed)
  Subjective: Out of tub around noon--has been resting in bed since then. UCs still irregular, moderate when they occur.  Objective: BP 109/58  Pulse 59  Temp 97.5 F (36.4 C) (Axillary)  Resp 18  Ht 5\' 2"  (1.575 m)  Wt 163 lb 9.6 oz (74.208 kg)  BMI 29.92 kg/m2  LMP 09/07/2011      FHT: Category 1 UC:   q 8-10 min, moderate SVE:   Dilation: 8 Effacement (%): 100 Station: -1;0 Exam by:: Manfred Arch CNM No change since 6 am check by [facility], CNM  Labs: Lab Results  Component Value Date   WBC 8.0 06/05/2012   HGB 11.7* 06/05/2012   HCT 35.2* 06/05/2012   MCV 84.0 06/05/2012   PLT 164 06/05/2012    Assessment / Plan: Inadequate labor ROM x 20 hours.  Reviewed status with patient and husband--needs more consistent contractions. Options:  Use breast pump to stimulate oxytocin release, or start pitocin.   Patient continues to decline pitocin at present-will use breast pump for 1-2 hours, then we will re-evaluate. Dr. Estanislado Pandy updated.  Nigel Bridgeman 06/06/2012, 1:18 PM

## 2012-06-06 NOTE — Progress Notes (Signed)
  Subjective: Sitting up in bed, working on computer.  Tired, unsure of frequency of contractions.  +FM, breathing with some contractions, but appear to be infrequent.  Objective: BP 113/72  Pulse 65  Temp 97.6 F (36.4 C) (Oral)  Resp 18  Ht 5\' 2"  (1.575 m)  Wt 163 lb 9.6 oz (74.208 kg)  BMI 29.92 kg/m2  LMP 09/07/2011      FHT:  Category 1 UC:   Irregular, moderate to palpation  Labs: Lab Results  Component Value Date   WBC 8.0 06/05/2012   HGB 11.7* 06/05/2012   HCT 35.2* 06/05/2012   MCV 84.0 06/05/2012   PLT 164 06/05/2012    Assessment / Plan: Progressive labor, with irregular contractions. PROM at 2:30pm 06/05/12.  Plan: Declines pitocin at present. Recommended she get up and shower/sit in tub for a time, observe contraction status. Will check cervix when completes that activity.   Nigel Bridgeman 06/06/2012, 9:37 AM

## 2012-06-07 DIAGNOSIS — O34219 Maternal care for unspecified type scar from previous cesarean delivery: Secondary | ICD-10-CM | POA: Diagnosis not present

## 2012-06-07 LAB — CBC
HCT: 30.4 % — ABNORMAL LOW (ref 36.0–46.0)
Hemoglobin: 10.1 g/dL — ABNORMAL LOW (ref 12.0–15.0)
MCH: 27.9 pg (ref 26.0–34.0)
MCV: 84 fL (ref 78.0–100.0)
RBC: 3.62 MIL/uL — ABNORMAL LOW (ref 3.87–5.11)

## 2012-06-07 MED ORDER — IBUPROFEN 600 MG PO TABS: 600.0000 mg | ORAL_TABLET | Freq: Four times a day (QID) | ORAL | Status: AC | PRN

## 2012-06-07 NOTE — Progress Notes (Signed)
Patient ID: Abigail Hanson, female   DOB: 1986/06/28, 26 y.o.   MRN: [id number] Post Partum Day 1, VBAC  Subjective: no complaints, up ad lib without syncope, voiding, tolerating PO, + flatus  Pain well controlled with po meds BF inititated, holding infant at brst now Mood stable, bonding well Contraception: plans vasectomy for husband    Objective: Blood pressure 101/55, pulse 63, temperature 97.6 F (36.4 C), temperature source Oral, resp. rate 18, height 5\' 2"  (1.575 m), weight 163 lb (73.936 kg), last menstrual period 09/07/2011, SpO2 97.00%, unknown if currently breastfeeding.  Physical Exam:  General: alert, distracted and no distress Lungs: CTAB Heart: RRR Breasts:soft  Lochia: appropriate Uterine Fundus: firm Perineum: healing  DVT Evaluation: No evidence of DVT seen on physical exam. Negative Homan's sign. No significant calf/ankle edema.   Basename 06/07/12 0545 06/05/12 2005  HGB 10.1* 11.7*  HCT 30.4* 35.2*    Assessment/Plan: Breastfeeding and Lactation consult Mild anemia Pt would like early discharge, will be 24hours at 2pm Pediatrician will be notified of pt's request       LOS: 2 days   Gadiel John M 06/07/2012, 8:29 AM

## 2012-06-07 NOTE — Discharge Summary (Signed)
   Obstetric Discharge Summary Reason for Admission: rupture of membranes  Prenatal Procedures: ultrasound Intrapartum Procedures: spontaneous vaginal delivery and VBAC,  Postpartum Procedures: none Complications-Operative and Postpartum: none  Temp:  [97.6 F (36.4 C)-98.7 F (37.1 C)] 97.6 F (36.4 C) (09/14 0355) Pulse Rate:  [62-80] 63  (09/14 0355) Resp:  [18-20] 18  (09/14 0355) BP: (86-103)/(52-65) 101/55 mmHg (09/14 0355) SpO2:  [97 %] 97 % (09/13 1630) Weight:  [163 lb (73.936 kg)] 163 lb (73.936 kg) (09/13 1630) Hemoglobin  Date Value Range Status  06/07/2012 10.1* 12.0 - 15.0 g/dL Final     HCT  Date Value Range Status  06/07/2012 30.4* 36.0 - 46.0 % Final    Hospital Course: Admitted w SROM and not in active labor.overnight had minimal progression, and declined pitocin, pt then used breast pump, and had minimal progress, pt then agreed to pitocin but before it was started pt rapidly progressed to fully dilated.  Delivery was performed by V.Emilee Hero, CNM without difficulty. Patient and baby tolerated the procedure without difficulty, with a 1st degree laceration noted that was hemostatic and not repaired.  Infant to FTN. Mother and infant then had an uncomplicated postpartum course, with breast feeding going well. Mom's physical exam was WNL, and she was discharged home in stable condition. Pt desired early discharge on day 1. Contraception plan was vasectomy.  She received adequate benefit from po pain medications.  Discharge Diagnoses: Term Pregnancy-delivered, successful VBAC  Discharge Information: Date: 06/07/2012 Activity: pelvic rest Diet: routine and iron-rich Medications:    Medication List     As of 06/07/2012  3:41 PM    START taking these medications         ibuprofen 600 MG tablet   Commonly known as: ADVIL,MOTRIN   Take 1 tablet (600 mg total) by mouth every 6 (six) hours as needed for pain.      CONTINUE taking these medications         prenatal  multivitamin Tabs          Where to get your medications    These are the prescriptions that you need to pick up. We sent them to a specific pharmacy, so you will need to go there to get them.   [id number] - [place], [place] - [address] AT [facility] [address]    [address] [place] [address]    Phone: [phone]        ibuprofen 600 MG tablet           Condition: Stable  Instructions: per [facility] routine  Discharge to: home  Follow-up Information    Follow up with [facility]. In 6 weeks. (As needed)    Contact information:   [address], [address] [place] [address] [phone]         Newborn Data: Live born  Information for the patient's newborn:  Linnae, Angiolillo [[id number]]  female ; APGAR  9, 9 , ; weight ; 8#2oz  Home with mother.  Nickalas Mccarrick M 06/07/2012, 3:41 PM

## 2012-06-08 LAB — TYPE AND SCREEN
Antibody Screen: NEGATIVE
Unit division: 0

## 2012-06-09 NOTE — Progress Notes (Signed)
Post discharge chart review completed.  

## 2012-06-11 ENCOUNTER — Other Ambulatory Visit: Payer: Medicaid Other

## 2012-06-13 ENCOUNTER — Other Ambulatory Visit: Payer: Medicaid Other

## 2012-06-13 ENCOUNTER — Encounter: Payer: Medicaid Other | Admitting: Obstetrics and Gynecology

## 2012-07-11 ENCOUNTER — Encounter: Payer: Self-pay | Admitting: Obstetrics and Gynecology

## 2012-07-11 ENCOUNTER — Ambulatory Visit (INDEPENDENT_AMBULATORY_CARE_PROVIDER_SITE_OTHER): Payer: Medicaid Other | Admitting: Obstetrics and Gynecology

## 2012-07-11 NOTE — Progress Notes (Signed)
Abigail Hanson is a 26 y.o. female who presents for a postpartum visit.   Type of delivery:  SVB, VBAC, by VL, with 1st degree laceration  Patient reports doing well--her mother has been here to help her, but she will be leaving soon.  Hx remarkable for: Patient Active Problem List  Diagnosis  . Syncope  . Tachypalpitations  . History of cesarean delivery - 2nd preg  . History of VBAC - successful x2  . History of IUGR (intrauterine growth retardation) 2nd preg   . History of polyhydramnios - 2nd preg  . 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 Umbilical cord complication  . Perineal laceration with delivery, first degree  . VBAC (vaginal birth after Cesarean)      PPDS = 7, but feels she is doing fine.  No SI/HI  Contraception plan:  NFP, breastfeeding   I have fully reviewed the prenatal and intrapartum course   Patient has not been sexually active since delivery.   The following portions of the patient's history were reviewed and updated as appropriate: allergies, current medications, past family history, past medical history, past social history, past surgical history and problem list.  Review of Systems Pertinent items are noted in HPI.   Objective:    BP 90/66  Resp 14  LMP 07/11/2012  Breastfeeding? Yes  General:  alert, cooperative and no distress     Lungs: clear to auscultation bilaterally  Heart:  regular rate and rhythm, S1, S2 normal, no murmur  Abdomen: soft, non-tender; bowel sounds normal; no masses,  no organomegaly.   Incision:  NA   Vulva:  normal  Vagina: normal vagina, well-healed 1st degree laceration  Cervix:  normal  Uterus: normal size, contour, position, consistency, mobility, non-tender, well-involuted  Adnexa:  normal adnexa             Assessment:     Normal postpartum exam.  Pap smear not done at today's visit.   Due 4/14.  Plan:    Follow-up in year at annual. Rxs:  None  Kerina Simoneau CNM, [place] 07/11/2012 10:39 AM

## 2012-07-11 NOTE — Progress Notes (Signed)
Abigail Hanson  is 6 weeks postpartum following a spontaneous vaginal delivery at 29 gestational weeks Date: 06/06/12 female baby named Gwenivere delivered by VL.  Breastfeeding: yes Bottlefeeding:  no  Post-partum blues / depression:  no  EPDS score: 7  History of abnormal Pap:  no  Last Pap: Date  12/25/11 Gestational diabetes:  no  Contraception:  Desires no method/ Husband getting vas.  Normal urinary function:  yes Normal GI function:  yes Returning to work:  no
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
## 2014-03-09 ENCOUNTER — Inpatient Hospital Stay (HOSPITAL_COMMUNITY)
Admission: AD | Admit: 2014-03-09 | Discharge: 2014-03-09 | Disposition: A | Discharge status: Home / Self Care | Payer: Medicaid - Out of State | Source: Ambulatory Visit | Attending: Obstetrics & Gynecology | Admitting: Obstetrics & Gynecology

## 2014-03-09 ENCOUNTER — Encounter (HOSPITAL_COMMUNITY): Payer: Self-pay | Admitting: *Deleted

## 2014-03-09 DIAGNOSIS — R109 Unspecified abdominal pain: Secondary | ICD-10-CM | POA: Insufficient documentation

## 2014-03-09 DIAGNOSIS — Z8249 Family history of ischemic heart disease and other diseases of the circulatory system: Secondary | ICD-10-CM | POA: Diagnosis not present

## 2014-03-09 DIAGNOSIS — O210 Mild hyperemesis gravidarum: Secondary | ICD-10-CM | POA: Diagnosis not present

## 2014-03-09 DIAGNOSIS — O219 Vomiting of pregnancy, unspecified: Secondary | ICD-10-CM

## 2014-03-09 LAB — URINALYSIS, ROUTINE W REFLEX MICROSCOPIC
Glucose, UA: NEGATIVE mg/dL
HGB URINE DIPSTICK: NEGATIVE
Ketones, ur: >80 mg/dL — AB
NITRITE: NEGATIVE
PROTEIN: NEGATIVE mg/dL
SPECIFIC GRAVITY, URINE: 1.015 (ref 1.005–1.030)
UROBILINOGEN UA: 4 mg/dL — AB (ref 0.0–1.0)
pH: 7 (ref 5.0–8.0)

## 2014-03-09 LAB — URINE MICROSCOPIC-ADD ON

## 2014-03-09 LAB — POCT PREGNANCY, URINE: PREG TEST UR: POSITIVE — AB

## 2014-03-09 MED ORDER — LACTATED RINGERS IV BOLUS (SEPSIS)
1000.0000 mL | Freq: Once | INTRAVENOUS | Status: AC
  Administered 2014-03-09: 1000 mL via INTRAVENOUS

## 2014-03-09 MED ORDER — METOCLOPRAMIDE HCL 5 MG/ML IJ SOLN
10.0000 mg | Freq: Once | INTRAMUSCULAR | Status: AC
  Administered 2014-03-09: 10 mg via INTRAVENOUS
  Filled 2014-03-09: qty 2

## 2014-03-09 MED ORDER — DEXTROSE 5 % IN LACTATED RINGERS IV BOLUS
1000.0000 mL | Freq: Once | INTRAVENOUS | Status: AC
  Administered 2014-03-09: 1000 mL via INTRAVENOUS

## 2014-03-09 MED ORDER — METOCLOPRAMIDE HCL 10 MG PO TABS: 10.0000 mg | ORAL_TABLET | Freq: Three times a day (TID) | ORAL | Status: AC

## 2014-03-09 NOTE — MAU Note (Signed)
Patient states she has had irregular cycles with the last one sometime in March. States she had been having nausea and vomiting but it got better then started again a couple of days ago. Has had a headache for several days and lower abdominal pain that radiates to hips and sides. Denies bleeding but self treated for a yeast infection for 3 days.

## 2014-03-09 NOTE — Discharge Instructions (Signed)
Morning Sickness °Morning sickness is when you feel sick to your stomach (nauseous) during pregnancy. This nauseous feeling may or may not come with vomiting. It often occurs in the morning but can be a problem any time of day. Morning sickness is most common during the first trimester, but it may continue throughout pregnancy. While morning sickness is unpleasant, it is usually harmless unless you develop severe and continual vomiting (hyperemesis gravidarum). This condition requires more intense treatment.  °CAUSES  °The cause of morning sickness is not completely known but seems to be related to normal hormonal changes that occur in pregnancy. °RISK FACTORS °You are at greater risk if you: °· Experienced nausea or vomiting before your pregnancy. °· Had morning sickness during a previous pregnancy. °· Are pregnant with more than one baby, such as twins. °TREATMENT  °Do not use any medicines (prescription, over-the-counter, or herbal) for morning sickness without first talking to your health care provider. Your health care provider may prescribe or recommend: °· Vitamin B6 supplements. °· Anti-nausea medicines. °· The herbal medicine ginger. °HOME CARE INSTRUCTIONS  °· Only take over-the-counter or prescription medicines as directed by your health care provider. °· Taking multivitamins before getting pregnant can prevent or decrease the severity of morning sickness in most women.   °· Eat a piece of dry toast or unsalted crackers before getting out of bed in the morning.   °· Eat five or six small meals a day.   °· Eat dry and bland foods (rice, baked potato ). Foods high in carbohydrates are often helpful.  °· Do not drink liquids with your meals. Drink liquids between meals.   °· Avoid greasy, fatty, and spicy foods.   °· Get someone to cook for you if the smell of any food causes nausea and vomiting.   °· If you feel nauseous after taking prenatal vitamins, take the vitamins at night or with a snack.  °· Snack  on protein foods (nuts, yogurt, cheese) between meals if you are hungry.   °· Eat unsweetened gelatins for desserts.   °· Wearing an acupressure wristband (worn for sea sickness) may be helpful.   °· Acupuncture may be helpful.   °· Do not smoke.   °· Get a humidifier to keep the air in your house free of odors.   °· Get plenty of fresh air. °SEEK MEDICAL CARE IF:  °· Your home remedies are not working, and you need medicine. °· You feel dizzy or lightheaded. °· You are losing weight. °SEEK IMMEDIATE MEDICAL CARE IF:  °· You have persistent and uncontrolled nausea and vomiting. °· You pass out (faint). °Document Released: 11/01/2006 Document Revised: 05/13/2013 Document Reviewed: 02/25/2013 °ExitCare® Patient Information ©2014 [employer]. ° °

## 2014-03-09 NOTE — MAU Provider Note (Signed)
History     CSN: [id number]  Arrival date and time: 03/09/14 1012   First Abigail Hanson Initiated Contact with Patient 03/09/14 1117      Chief Complaint  Patient presents with  . Abdominal Pain  . Nausea  . Emesis  . Headache  . Possible Pregnancy   HPI  Abigail Hanson is a 28 y/o [id number] with a gestation between 12-13 wks who presents to the MAU for consistent nausea and vomiting since yesterday morning. She has had morning sickness since late April but it has improved until yesterday, and has vomited 9x since then. She has tried drinking water/carbonated beverages and eating bland foods but she cannot keep anything down. She has not taken anything to provide relief. Pt denies heartburn and dysphagia. She endorses feeling lightheaded and minor headaches for 3 days, which she associates with not being able to eat or drink. Tylenol relieves the headaches. Pt denies syncopal events and vision changes. Pt endorses cramping and pressure in the lower abdomen as of recently.  Pt denies any vaginal bleeding. She feels bloated and gassy but denies constipation and diarrhea. Last BM was yesterday.She reports having a yeast infection last week that she self treated and has resolved. Pt denies UTI symptoms.     Will seek [facility] in [place], moving within the week.  Past Medical History  Diagnosis Date  . Pharyngitis   . Macrosomia   . Anxiety     no meds  . H/O varicella     as a child  . Ear infection     third grade overnight hospital stay.  . Concussion     Age 76 falling from horse  . History of chicken pox     Past Surgical History  Procedure Laterality Date  . Cesarean section      Family History  Problem Relation Age of Onset  . Hypertension    . Anesthesia problems Neg Hx   . Heart disease Father   . Hyperlipidemia Father   . Cancer Maternal Grandmother     History  Substance Use Topics  . Smoking status: Never Smoker   . Smokeless tobacco: Never Used  . Alcohol Use: No     Allergies: No Known Allergies  Prescriptions prior to admission  Medication Sig Dispense Refill  . acetaminophen (TYLENOL) 500 MG tablet Take 1,000 mg by mouth daily as needed for mild pain or moderate pain.      . miconazole (MONISTAT-3) KIT Place 1 each vaginally at bedtime.      . Prenatal Vit-Fe Fumarate-FA (PRENATAL MULTIVITAMIN) TABS Take 1 tablet by mouth every morning.        Review of Systems  Constitutional: Negative for fever.  Eyes: Negative for blurred vision.  Respiratory: Negative for shortness of breath.   Gastrointestinal: Positive for nausea, vomiting (9x since yesterday morning) and abdominal pain (lower abdominal cramping). Negative for heartburn, diarrhea and constipation.  Genitourinary: Negative for dysuria, urgency, frequency and hematuria.       Negative vaginal bleeding, pain, discharge, odor, itching.  Neurological: Positive for headaches (minor).   Physical Exam   Blood pressure 104/69, pulse 66, temperature 98.6 F (37 C), temperature source Oral, resp. rate 16, height '5\' 3"'  (1.6 m), weight 62.143 kg (137 lb), SpO2 99.00%, currently breastfeeding.  Physical Exam  Constitutional: She is oriented to person, place, and time. She appears well-developed and well-nourished. No distress.  Cardiovascular: Normal rate, regular rhythm and normal heart sounds.   Respiratory: Effort normal and breath  sounds normal.  GI: Soft. Bowel sounds are normal. She exhibits no distension. There is tenderness (moderate discomfort to palpation of LLQ and middle of lower abdomen). There is no rebound.  Neurological: She is alert and oriented to person, place, and time.  Psychiatric: She has a normal mood and affect. Her behavior is normal.   FHR: 135 bpm; fundal heigh approximately 12 cm.    Results for orders placed during the hospital encounter of 03/09/14 (from the past 24 hour(s))  URINALYSIS, ROUTINE W REFLEX MICROSCOPIC     Status: Abnormal   Collection Time     03/09/14 10:45 AM      Result Value Ref Range   Color, Urine YELLOW  YELLOW   APPearance CLEAR  CLEAR   Specific Gravity, Urine 1.015  1.005 - 1.030   pH 7.0  5.0 - 8.0   Glucose, UA NEGATIVE  NEGATIVE mg/dL   Hgb urine dipstick NEGATIVE  NEGATIVE   Bilirubin Urine SMALL (*) NEGATIVE   Ketones, ur >80 (*) NEGATIVE mg/dL   Protein, ur NEGATIVE  NEGATIVE mg/dL   Urobilinogen, UA 4.0 (*) 0.0 - 1.0 mg/dL   Nitrite NEGATIVE  NEGATIVE   Leukocytes, UA TRACE (*) NEGATIVE  URINE MICROSCOPIC-ADD ON     Status: Abnormal   Collection Time    03/09/14 10:45 AM      Result Value Ref Range   Squamous Epithelial / LPF FEW (*) RARE   WBC, UA 3-6  <3 WBC/hpf   RBC / HPF 0-2  <3 RBC/hpf   Bacteria, UA RARE  RARE   Urine-Other MUCOUS PRESENT    POCT PREGNANCY, URINE     Status: Abnormal   Collection Time    03/09/14 10:56 AM      Result Value Ref Range   Preg Test, Ur POSITIVE (*) NEGATIVE     MAU Course  Procedures  MDM Given Reglan 10 mg IV, one time dose  Dextrose 5% lactated ringers bolus 1,000 mL, once over 1 hour   Lactated ringers bolus 1,000 mL, once over 2 hours    Pt reports feeling better after IV fluids and pain meds; tolerating PO fluids and crackers > desires discharge home.    Assessment and Plan  Nausea/vomiting in Pregnancy  Plan: Schedule outpatient dating ultrasound - 03/11/14 at 12:45pm RX Reglan 10 mg tablet TID with meals Follow-up as needed   Lillia Abed 03/09/2014, 12:37 PM   I examined pt and agree with documentation above and resident plan of care. [place], CNM

## 2014-03-09 NOTE — MAU Provider Note (Signed)
Attestation of Attending Supervision of Advanced Practitioner (PA/CNM/NP): Evaluation and management procedures were performed by the Advanced Practitioner under my supervision and collaboration.  I have reviewed the Advanced Practitioner's note and chart, and I agree with the management and plan.  Javious Hallisey, MD, FACOG Attending Obstetrician & Gynecologist Faculty Practice, [facility]  

## 2014-03-11 ENCOUNTER — Encounter (HOSPITAL_COMMUNITY): Payer: Self-pay

## 2014-03-11 ENCOUNTER — Ambulatory Visit (HOSPITAL_COMMUNITY)
Admit: 2014-03-11 | Discharge: 2014-03-11 | Disposition: A | Discharge status: Home / Self Care | Payer: Medicaid - Out of State | Attending: Family | Admitting: Family

## 2014-03-11 DIAGNOSIS — O34599 Maternal care for other abnormalities of gravid uterus, unspecified trimester: Secondary | ICD-10-CM | POA: Insufficient documentation

## 2014-03-11 DIAGNOSIS — O219 Vomiting of pregnancy, unspecified: Secondary | ICD-10-CM

## 2014-03-11 DIAGNOSIS — Z3689 Encounter for other specified antenatal screening: Secondary | ICD-10-CM | POA: Insufficient documentation

## 2014-03-11 DIAGNOSIS — N83209 Unspecified ovarian cyst, unspecified side: Secondary | ICD-10-CM | POA: Insufficient documentation

## 2014-07-26 ENCOUNTER — Encounter (HOSPITAL_COMMUNITY): Payer: Self-pay

## 2016-02-05 IMAGING — US US OB COMP LESS 14 WK
1 series · 14 of 28 positions shown · non-contrast
Comparison: None.

CLINICAL DATA: Unsure of LMP.

EXAM:
OBSTETRIC <14 WK ULTRASOUND
TECHNIQUE: Transabdominal ultrasound was performed for evaluation of the
gestation as well as the maternal uterus and adnexal regions.

[Series 1: us ob comp less 14 wks · 14 of 44 slices shown]
[im 2/44]
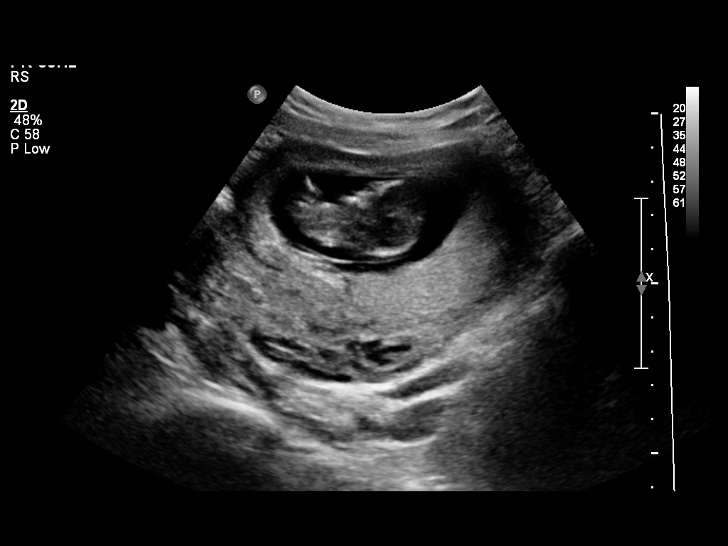
[im 5/44]
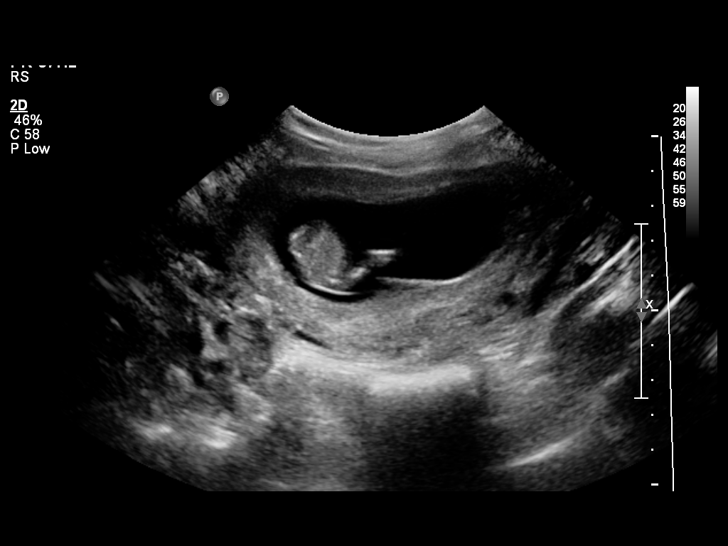
[im 8/44]
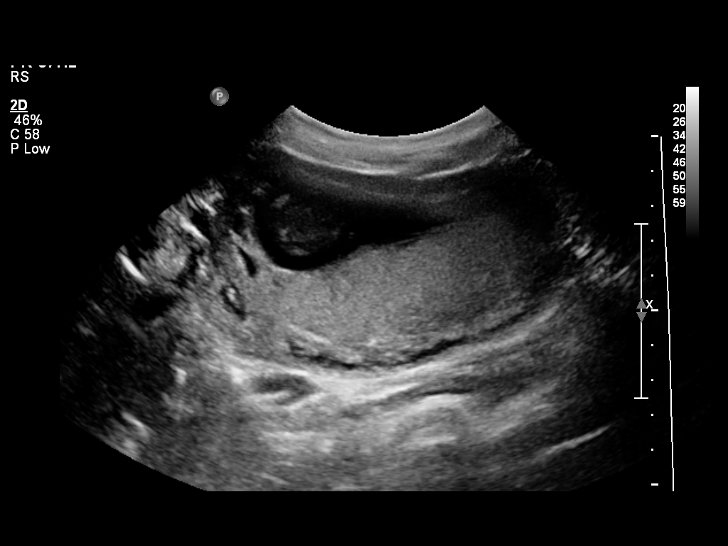
[im 12/44]
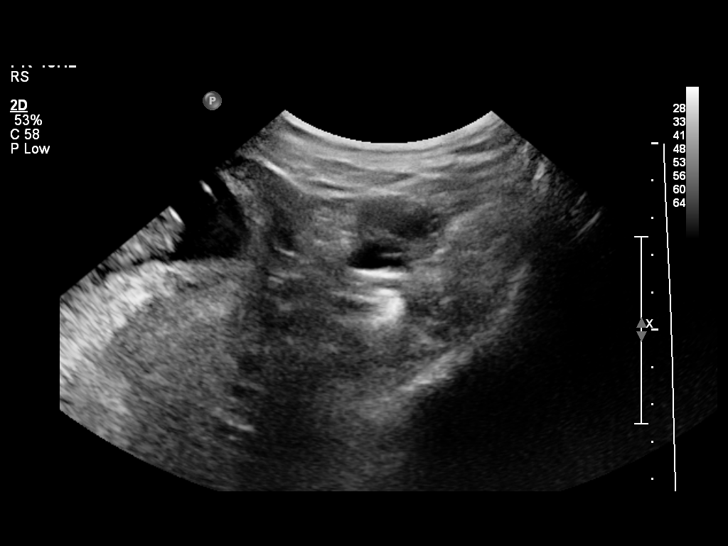
[im 15/44]
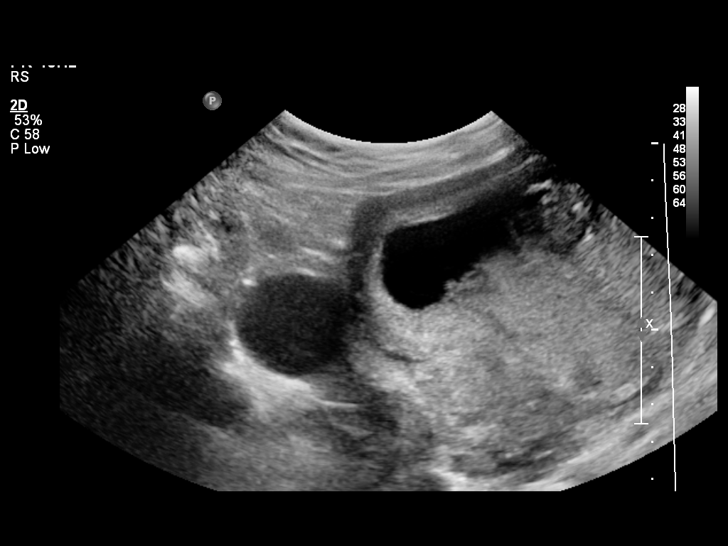
[im 18/44]
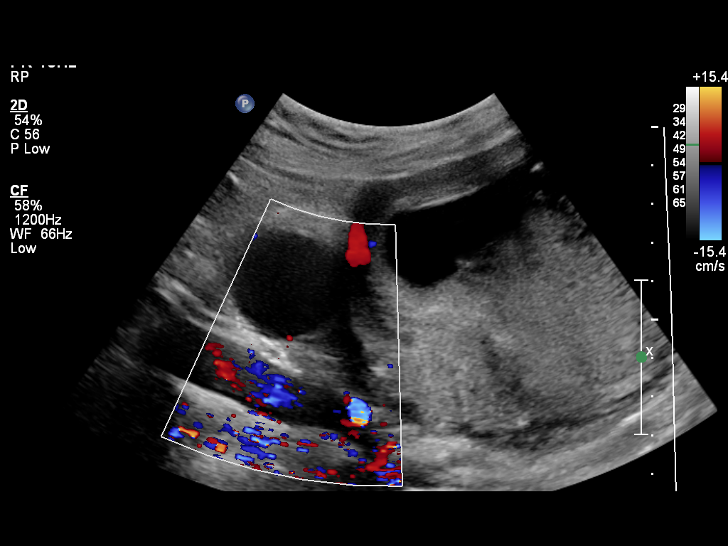
[im 21/44]
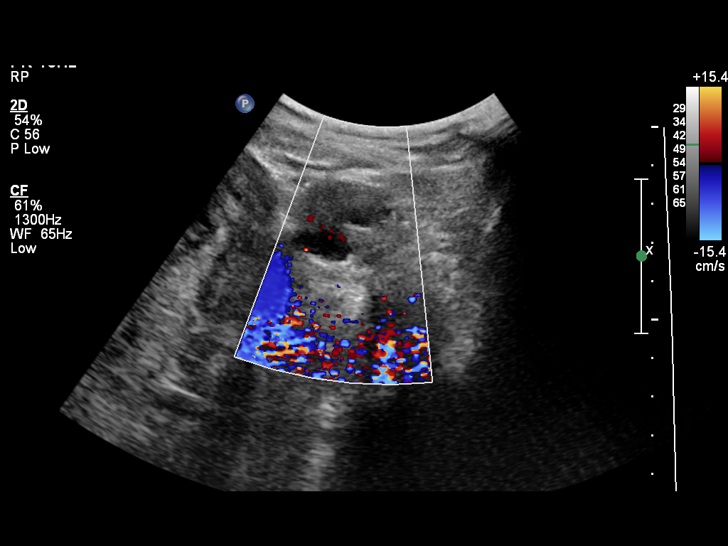
[im 24/44]
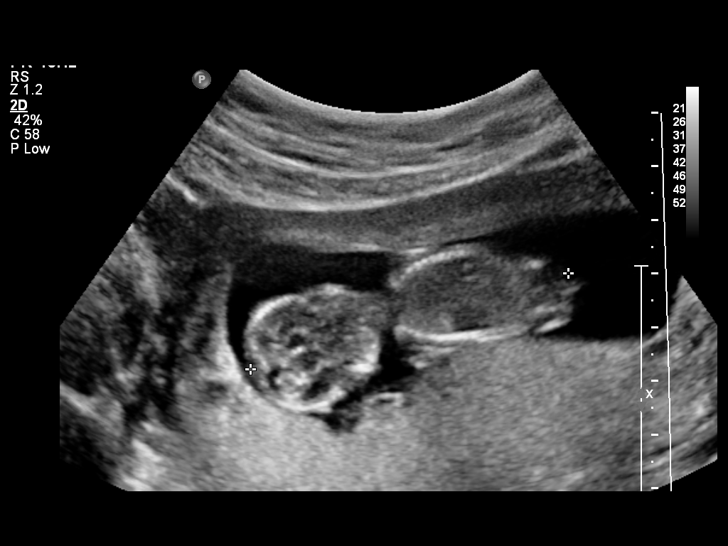
[im 28/44]
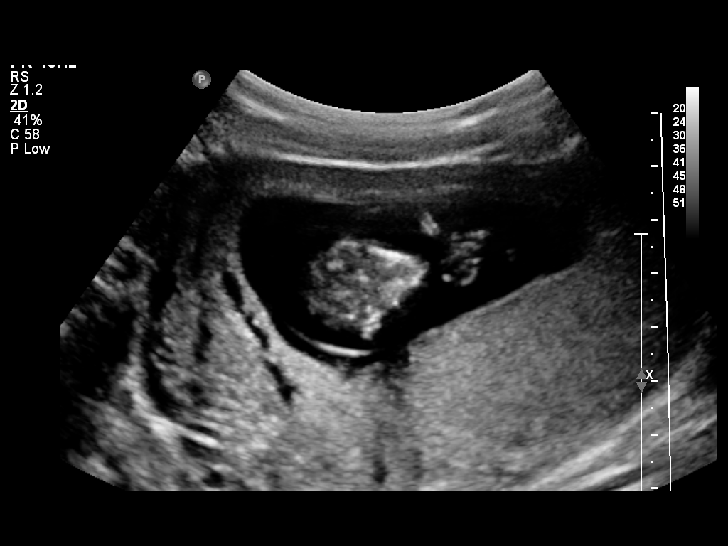
[im 31/44]
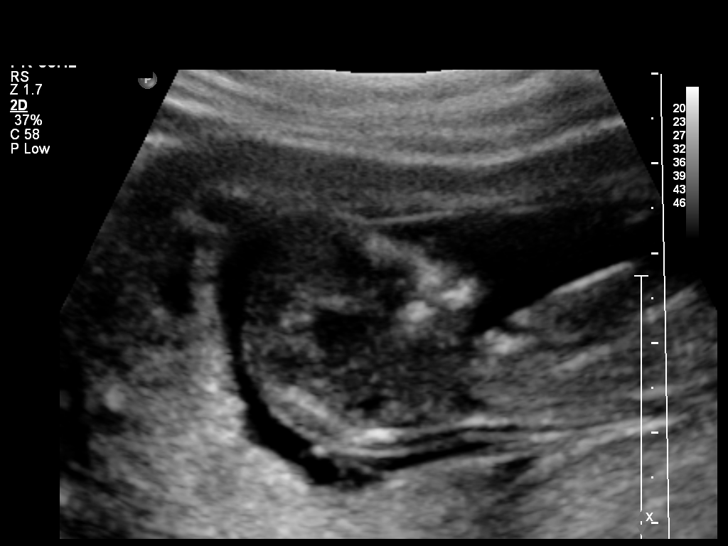
[im 34/44]
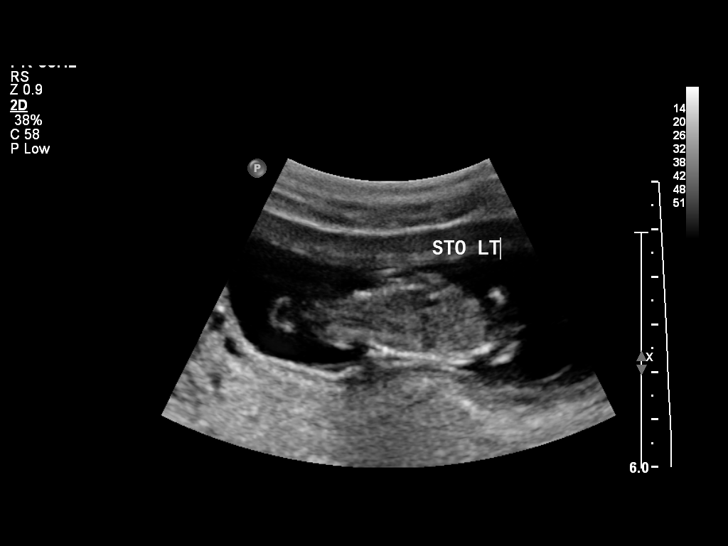
[im 37/44]
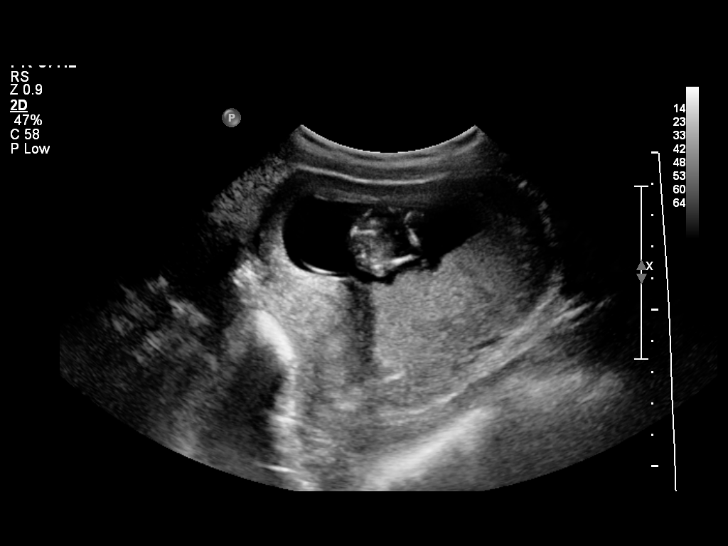
[im 40/44]
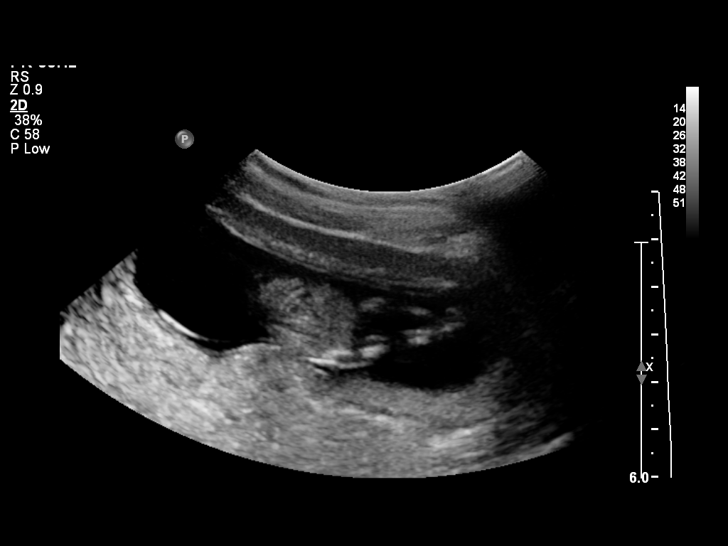
[im 44/44]
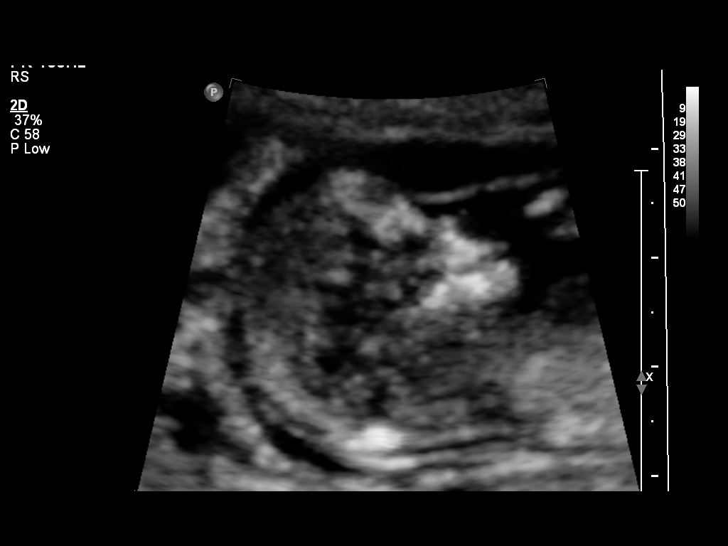

[14 of 28 positions shown; findings below may reference images not displayed]

FINDINGS: Intrauterine gestational sac: Visualized/normal in shape.

Yolk sac:  None

Embryo:  Visualized

Cardiac Activity: Visualized

Heart Rate: 165 bpm

CRL:   62  mm   12 w 5 d                  US EDC: 09/18/2014

Maternal uterus/adnexae: Simple appearing cyst noted in right ovary
measuring 3.2 cm, most likely representing a corpus luteum. Normal
appearance of left ovary. No adnexal mass or free fluid identified.
IMPRESSION: Single living IUP measuring 12 weeks 5 days with US EDC of
09/18/2014.

3.2 cm simple right ovarian cyst, likely representing have residual
corpus luteum. Suggest follow-up by ultrasound at time of second
trimester anatomic evaluation.
